# Patient Record
Sex: Female | Born: 1990 | Race: Black or African American | Marital: Single | Smoking: Never smoker
Health system: Southern US, Academic
[De-identification: ages and names within clinical notes are randomized; demographics above are authoritative.]

## PROBLEM LIST (undated history)

## (undated) ENCOUNTER — Inpatient Hospital Stay (HOSPITAL_COMMUNITY): Payer: Self-pay

## (undated) DIAGNOSIS — E119 Type 2 diabetes mellitus without complications: Secondary | ICD-10-CM

## (undated) DIAGNOSIS — N390 Urinary tract infection, site not specified: Secondary | ICD-10-CM

## (undated) DIAGNOSIS — J309 Allergic rhinitis, unspecified: Secondary | ICD-10-CM

## (undated) HISTORY — DX: Urinary tract infection, site not specified: N39.0

## (undated) HISTORY — PX: NO PAST SURGERIES: SHX2092

## (undated) HISTORY — DX: Allergic rhinitis, unspecified: J30.9

## (undated) HISTORY — DX: Type 2 diabetes mellitus without complications: E11.9

## (undated) HISTORY — DX: Type 2 diabetes mellitus without complications (CMS HCC): E11.9

---
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
## 2013-08-11 ENCOUNTER — Other Ambulatory Visit (HOSPITAL_COMMUNITY)
Admission: RE | Admit: 2013-08-11 | Discharge: 2013-08-11 | Disposition: A | Discharge status: Home / Self Care | Payer: Self-pay | Source: Ambulatory Visit | Attending: Family Medicine | Admitting: Family Medicine

## 2013-08-11 ENCOUNTER — Emergency Department (INDEPENDENT_AMBULATORY_CARE_PROVIDER_SITE_OTHER)
Admission: EM | Admit: 2013-08-11 | Discharge: 2013-08-11 | Disposition: A | Discharge status: Home / Self Care | Payer: Self-pay | Source: Home / Self Care

## 2013-08-11 ENCOUNTER — Encounter (HOSPITAL_COMMUNITY): Payer: Self-pay | Admitting: Emergency Medicine

## 2013-08-11 DIAGNOSIS — N72 Inflammatory disease of cervix uteri: Secondary | ICD-10-CM

## 2013-08-11 DIAGNOSIS — K625 Hemorrhage of anus and rectum: Secondary | ICD-10-CM

## 2013-08-11 DIAGNOSIS — M545 Low back pain, unspecified: Secondary | ICD-10-CM

## 2013-08-11 DIAGNOSIS — B3731 Acute candidiasis of vulva and vagina: Secondary | ICD-10-CM

## 2013-08-11 DIAGNOSIS — N898 Other specified noninflammatory disorders of vagina: Secondary | ICD-10-CM

## 2013-08-11 DIAGNOSIS — A499 Bacterial infection, unspecified: Secondary | ICD-10-CM

## 2013-08-11 DIAGNOSIS — N760 Acute vaginitis: Secondary | ICD-10-CM | POA: Insufficient documentation

## 2013-08-11 DIAGNOSIS — B9689 Other specified bacterial agents as the cause of diseases classified elsewhere: Secondary | ICD-10-CM

## 2013-08-11 DIAGNOSIS — B373 Candidiasis of vulva and vagina: Secondary | ICD-10-CM

## 2013-08-11 DIAGNOSIS — Z113 Encounter for screening for infections with a predominantly sexual mode of transmission: Secondary | ICD-10-CM | POA: Insufficient documentation

## 2013-08-11 HISTORY — DX: Type 2 diabetes mellitus without complications: E11.9

## 2013-08-11 LAB — POCT URINALYSIS DIP (DEVICE)
Bilirubin Urine: NEGATIVE
Glucose, UA: 100 mg/dL — AB
Ketones, ur: NEGATIVE mg/dL
NITRITE: NEGATIVE
PH: 6.5 (ref 5.0–8.0)
PROTEIN: NEGATIVE mg/dL
Specific Gravity, Urine: 1.025 (ref 1.005–1.030)
Urobilinogen, UA: 0.2 mg/dL (ref 0.0–1.0)

## 2013-08-11 LAB — POCT PREGNANCY, URINE: Preg Test, Ur: NEGATIVE

## 2013-08-11 MED ORDER — AZITHROMYCIN 250 MG PO TABS: ORAL_TABLET | ORAL | Status: DC

## 2013-08-11 MED ORDER — FLUCONAZOLE 150 MG PO TABS: ORAL_TABLET | ORAL | Status: DC

## 2013-08-11 MED ORDER — METRONIDAZOLE 500 MG PO TABS: 500.0000 mg | ORAL_TABLET | Freq: Two times a day (BID) | ORAL | Status: DC

## 2013-08-11 NOTE — Discharge Instructions (Signed)
Back Pain, Adult Back pain is very common. The pain often gets better over time. The cause of back pain is usually not dangerous. Most people can learn to manage their back pain on their own.  HOME CARE   Stay active. Start with short walks on flat ground if you can. Try to walk farther each day.  Do not sit, drive, or stand in one place for more than 30 minutes. Do not stay in bed.  Do not avoid exercise or work. Activity can help your back heal faster.  Be careful when you bend or lift an object. Bend at your knees, keep the object close to you, and do not twist.  Sleep on a firm mattress. Lie on your side, and bend your knees. If you lie on your back, put a pillow under your knees.  Only take medicines as told by your doctor.  Put ice on the injured area.  Put ice in a plastic bag.  Place a towel between your skin and the bag.  Leave the ice on for 15-20 minutes, 03-04 times a day for the first 2 to 3 days. After that, you can switch between ice and heat packs.  Ask your doctor about back exercises or massage.  Avoid feeling anxious or stressed. Find good ways to deal with stress, such as exercise. GET HELP RIGHT AWAY IF:   Your pain does not go away with rest or medicine.  Your pain does not go away in 1 week.  You have new problems.  You do not feel well.  The pain spreads into your legs.  You cannot control when you poop (bowel movement) or pee (urinate).  Your arms or legs feel weak or lose feeling (numbness).  You feel sick to your stomach (nauseous) or throw up (vomit).  You have belly (abdominal) pain.  You feel like you may pass out (faint). MAKE SURE YOU:   Understand these instructions.  Will watch your condition.  Will get help right away if you are not doing well or get worse. Document Released: 06/25/2007 Document Revised: 03/31/2011 Document Reviewed: 05/10/2013 [facility] Patient Information 2015 [place], [place]. This information is not intended  to replace advice given to you by your health care provider. Make sure you discuss any questions you have with your health care provider.  Bacterial Vaginosis Bacterial vaginosis is a vaginal infection that occurs when the normal balance of bacteria in the vagina is disrupted. It results from an overgrowth of certain bacteria. This is the most common vaginal infection in women of childbearing age. Treatment is important to prevent complications, especially in pregnant women, as it can cause a premature delivery. CAUSES  Bacterial vaginosis is caused by an increase in harmful bacteria that are normally present in smaller amounts in the vagina. Several different kinds of bacteria can cause bacterial vaginosis. However, the reason that the condition develops is not fully understood. RISK FACTORS Certain activities or behaviors can put you at an increased risk of developing bacterial vaginosis, including:  Having a new sex partner or multiple sex partners.  Douching.  Using an intrauterine device (IUD) for contraception. Women do not get bacterial vaginosis from toilet seats, bedding, swimming pools, or contact with objects around them. SIGNS AND SYMPTOMS  Some women with bacterial vaginosis have no signs or symptoms. Common symptoms include:  Grey vaginal discharge.  A fishlike odor with discharge, especially after sexual intercourse.  Itching or burning of the vagina and vulva.  Burning or pain with  urination. DIAGNOSIS  Your health care provider will take a medical history and examine the vagina for signs of bacterial vaginosis. A sample of vaginal fluid may be taken. Your health care provider will look at this sample under a microscope to check for bacteria and abnormal cells. A vaginal pH test may also be done.  TREATMENT  Bacterial vaginosis may be treated with antibiotic medicines. These may be given in the form of a pill or a vaginal cream. A second round of antibiotics may be  prescribed if the condition comes back after treatment.  HOME CARE INSTRUCTIONS   Only take over-the-counter or prescription medicines as directed by your health care provider.  If antibiotic medicine was prescribed, take it as directed. Make sure you finish it even if you start to feel better.  Do not have sex until treatment is completed.  Tell all sexual partners that you have a vaginal infection. They should see their health care provider and be treated if they have problems, such as a mild rash or itching.  Practice safe sex by using condoms and only having one sex partner. SEEK MEDICAL CARE IF:   Your symptoms are not improving after 3 days of treatment.  You have increased discharge or pain.  You have a fever. MAKE SURE YOU:   Understand these instructions.  Will watch your condition.  Will get help right away if you are not doing well or get worse. FOR MORE INFORMATION  Centers for Disease Control and Prevention, Division of STD Prevention: SolutionApps.co.zawww.cdc.gov/std American Sexual Health Association (ASHA): www.ashastd.org  Document Released: 01/06/2005 Document Revised: 10/27/2012 Document Reviewed: 08/18/2012 [facility] Patient Information 2015 [place], [place]. This information is not intended to replace advice given to you by your health care provider. Make sure you discuss any questions you have with your health care provider.  Candidal Vulvovaginitis Candidal vulvovaginitis is an infection of the vagina and vulva. The vulva is the skin around the opening of the vagina. This may cause itching and discomfort in and around the vagina.  HOME CARE  Only take medicine as told by your doctor.  Do not have sex (intercourse) until the infection is healed or as told by your doctor.  Practice safe sex.  Tell your sex partner about your infection.  Do not douche or use tampons.  Wear cotton underwear. Do not wear tight pants or panty hose.  Eat yogurt. This may help treat and  prevent yeast infections. GET HELP RIGHT AWAY IF:   You have a fever.  Your problems get worse during treatment or do not get better in 3 days.  You have discomfort, irritation, or itching in your vagina or vulva area.  You have pain after sex.  You start to get belly (abdominal) pain. MAKE SURE YOU:  Understand these instructions.  Will watch your condition.  Will get help right away if you are not doing well or get worse. Document Released: 04/04/2008 Document Revised: 01/11/2013 Document Reviewed: 04/04/2008 [facility] Patient Information 2015 [place], [place]. This information is not intended to replace advice given to you by your health care provider. Make sure you discuss any questions you have with your health care provider.  Cervicitis Cervicitis is a soreness and swelling (inflammation) of the cervix. Your cervix is located at the bottom of your uterus. It opens up to the vagina. CAUSES   Sexually transmitted infections (STIs).   Allergic reaction.   Medicines or birth control devices that are put in the vagina.   Injury  to the cervix.   Bacterial infections.  RISK FACTORS You are at greater risk if you:  Have unprotected sexual intercourse.  Have sexual intercourse with many partners.  Began sexual intercourse at an early age.  Have a history of STIs. SYMPTOMS  There may be no symptoms. If symptoms occur, they may include:   Gray, white, yellow, or bad-smelling vaginal discharge.   Pain or itching of the area outside the vagina.   Painful sexual intercourse.   Lower abdominal or lower back pain, especially during intercourse.   Frequent urination.   Abnormal vaginal bleeding between periods, after sexual intercourse, or after menopause.   Pressure or a heavy feeling in the pelvis.  DIAGNOSIS  Diagnosis is made after a pelvic exam. Other tests may include:   Examination of any discharge under a microscope (wet prep).   A Pap test.   TREATMENT  Treatment will depend on the cause of cervicitis. If it is caused by an STI, both you and your partner will need to be treated. Antibiotic medicines will be given.  HOME CARE INSTRUCTIONS   Do not have sexual intercourse until your health care provider says it is okay.   Do not have sexual intercourse until your partner has been treated, if your cervicitis is caused by an STI.   Take your antibiotics as directed. Finish them even if you start to feel better.  SEEK MEDICAL CARE IF:  Your symptoms come back.   You have a fever.  MAKE SURE YOU:   Understand these instructions.  Will watch your condition.  Will get help right away if you are not doing well or get worse. Document Released: 01/06/2005 Document Revised: 01/11/2013 Document Reviewed: 06/30/2012 [facility] Patient Information 2015 [place], [place]. This information is not intended to replace advice given to you by your health care provider. Make sure you discuss any questions you have with your health care provider.  Rectal Bleeding  Rectal bleeding is when blood comes out of the opening of the butt (anus). Rectal bleeding may show up as bright red blood or really dark poop (stool). The poop may look dark red, maroon, or black. Rectal bleeding is often a sign that something is wrong. This needs to be checked by a doctor.  HOME CARE  Eat a diet high in fiber. This will help keep your poop soft.  Limit activity.  Drink enough fluids to keep your pee (urine) clear or pale yellow.  Take a warm bath to soothe any pain.  Follow up with your doctor as told. GET HELP RIGHT AWAY IF:  You have more bleeding.  You have black or dark red poop.  You throw up (vomit) blood or it looks like coffee grounds.  You have belly (abdominal) pain or tenderness.  You have a fever.  You feel weak, sick to your stomach (nauseous), or you pass out (faint).  You have pain that is so bad you cannot poop (bowel  movement). MAKE SURE YOU:  Understand these instructions.  Will watch your condition.  Will get help right away if you are not doing well or get worse. Document Released: 09/18/2010 Document Revised: 05/23/2013 Document Reviewed: 09/18/2010 [facility] Patient Information 2015 [place], [place]. This information is not intended to replace advice given to you by your health care provider. Make sure you discuss any questions you have with your health care provider.

## 2013-08-11 NOTE — ED Notes (Signed)
Pt  Has  Symptoms of  Low  abd  Pain  With  Vaginal  Discharge  As  Well  As   Back  Pain  And  Bright  Red  Rectal bleeding      Symptoms     X  6  Days    Getting  Worse     Pt  Ambulates  With a  Steady  Upright fluid  Gait

## 2013-08-11 NOTE — ED Provider Notes (Signed)
Medical screening examination/treatment/procedure(s) were performed by a resident physician or non-physician practitioner and as the supervising physician I was immediately available for consultation/collaboration.  Shelly Flattenavid Merrell, MD Family Medicine   Ozella Rocksavid J Merrell, MD 08/11/13 2138

## 2013-08-11 NOTE — ED Provider Notes (Signed)
CSN: [id number]     Arrival date & time 08/11/13  1402 History   First MD Initiated Contact with Patient 08/11/13 1413     Chief Complaint  Patient presents with  . Vaginal Discharge   (Consider location/radiation/quality/duration/timing/severity/associated sxs/prior Treatment) HPI Comments: 23 y o f with bright red rectal bleeding on 3 occasions on the past week, most recent this AM. No rectal pain. Pelvic pain and vag d/c for 2-3 d.  Sharp pain L low back since last PM Mild Headache.   Past Medical History  Diagnosis Date  . Diabetes mellitus without complication    History reviewed. No pertinent past surgical history. History reviewed. No pertinent family history. History  Substance Use Topics  . Smoking status: Never Smoker   . Smokeless tobacco: Not on file  . Alcohol Use: Yes   OB History   Grav Para Term Preterm Abortions TAB SAB Ect Mult Living                 Review of Systems  Constitutional: Positive for activity change. Negative for fever, chills and fatigue.  Respiratory: Negative for cough and shortness of breath.   Cardiovascular: Negative for chest pain and palpitations.  Gastrointestinal: Negative for nausea, vomiting, abdominal pain, diarrhea and constipation.  Genitourinary: Positive for frequency, vaginal discharge and pelvic pain. Negative for dysuria, hematuria, flank pain, decreased urine volume, vaginal bleeding, difficulty urinating, vaginal pain and menstrual problem.  Musculoskeletal: Negative.   Skin: Negative for rash.  Neurological: Positive for headaches. Negative for dizziness and facial asymmetry.    Allergies  Review of patient's allergies indicates no known allergies.  Home Medications   Prior to Admission medications   Medication Sig Start Date End Date Taking? Authorizing Provider  METFORMIN HCL PO Take by mouth.   Yes Historical Provider, MD  azithromycin (ZITHROMAX) 250 MG tablet Take all 4 tabs po now 08/11/13   Hayden Rasmussenavid Jame Seelig, NP   fluconazole (DIFLUCAN) 150 MG tablet 1 tab po x 1. May repeat in 72 hours if no improvement 08/11/13   Hayden Rasmussenavid Enrica Corliss, NP  metroNIDAZOLE (FLAGYL) 500 MG tablet Take 1 tablet (500 mg total) by mouth 2 (two) times daily. X 7 days 08/11/13   Hayden Rasmussenavid Xitlali Kastens, NP   BP 114/61  Pulse 74  Temp(Src) 97.1 F (36.2 C) (Oral)  Resp 16  SpO2 100%  LMP 07/13/2013 Physical Exam  Nursing note and vitals reviewed. Constitutional: She is oriented to person, place, and time. She appears well-developed and well-nourished. No distress.  HENT:  Head: Normocephalic and atraumatic.  Eyes: Conjunctivae and EOM are normal. Pupils are equal, round, and reactive to light.  Neck: Normal range of motion.  Cardiovascular: Normal rate, regular rhythm and normal heart sounds.   Pulmonary/Chest: Effort normal and breath sounds normal. No respiratory distress.  Abdominal: Soft. Bowel sounds are normal. She exhibits no distension and no mass. There is no tenderness. There is no rebound and no guarding.  Genitourinary: Guaiac negative stool.  NEFG with small deposits of discharge on labia Vagina with clumpy brown discharge adhering to walls Cx L of midline coated with a white to gray thick d/c. Ectocx pink, no lesions + mild CMT and L adnexal discomfort/tenderness. Scant amt of blood in vag vault. Menses due to start tomorrow.   Rectal: no lesions internal or external. No tenderness. No palpated masses/lesions. Guiac negative.  Musculoskeletal: She exhibits no edema and no tenderness.  Neurological: She is alert and oriented to person, place, and time. She exhibits  normal muscle tone.  Skin: Skin is warm and dry. No rash noted.    ED Course  Procedures (including critical care time) Labs Review Labs Reviewed  POCT URINALYSIS DIP (DEVICE) - Abnormal; Notable for the following:    Glucose, UA 100 (*)    Hgb urine dipstick MODERATE (*)    Leukocytes, UA SMALL (*)    All other components within normal limits  URINE CULTURE   POCT PREGNANCY, URINE    Imaging Review No results found. Results for orders placed during the hospital encounter of 08/11/13  POCT URINALYSIS DIP (DEVICE)      Result Value Ref Range   Glucose, UA 100 (*) NEGATIVE mg/dL   Bilirubin Urine NEGATIVE  NEGATIVE   Ketones, ur NEGATIVE  NEGATIVE mg/dL   Specific Gravity, Urine 1.025  1.005 - 1.030   Hgb urine dipstick MODERATE (*) NEGATIVE   pH 6.5  5.0 - 8.0   Protein, ur NEGATIVE  NEGATIVE mg/dL   Urobilinogen, UA 0.2  0.0 - 1.0 mg/dL   Nitrite NEGATIVE  NEGATIVE   Leukocytes, UA SMALL (*) NEGATIVE  POCT PREGNANCY, URINE      Result Value Ref Range   Preg Test, Ur NEGATIVE  NEGATIVE     MDM   1. Bright red rectal bleeding   2. BV (bacterial vaginosis)   3. Vaginal discharge   4. Monilial vaginitis   5. Cervicitis   6. Left-sided low back pain without sciatica    Heat and ibuprofen to left low back. This is musculoskeletal. Rx for azithromycin Flagyl And diflucan Must f/u with PCP for eval of bleeding and diabetes. Info given above. Stable on D/C.     Hayden Rasmussen, NP 08/11/13 1455

## 2013-08-12 LAB — URINE CULTURE: Colony Count: 40000

## 2013-08-13 ENCOUNTER — Telehealth (HOSPITAL_COMMUNITY): Payer: Self-pay

## 2013-08-13 NOTE — ED Notes (Signed)
Patient came in today requesting work note.  Patient has not been back to work since visit on 08/11/2013.  Chart reviewed by Hayden Rasmussenavid Mabe NP.  He stated she could have a work note for the day of the visit

## 2013-08-15 NOTE — ED Notes (Addendum)
GC/Chlamydia neg., Affirm: Candida, Gardnerella and Trich pos., Urine culture: 40,000 colonies multiple bacterial types, none predominant.  I called pt. Pt. verified x 2 and given results.  Pt. told she was adequately treated for the yeast infection with Diflucan and bacterial vaginosis and Trich with Flagyl.  Instructed to finish all of the medication, to notify her partner to be tested for Trich and treated with Flagyl, no sex until she has finished her medication and her partner has been treated and to practice safe sex. [facility]old [facility].  Pt. instructed to get rechecked if any symptoms of UTI.  States she is getting better.  Pt.'s questions answered. Vassie MoselleYork, Makayla Meyer 08/15/2013

## 2013-10-21 ENCOUNTER — Emergency Department (HOSPITAL_COMMUNITY)
Admission: EM | Admit: 2013-10-21 | Discharge: 2013-10-21 | Disposition: A | Discharge status: Home / Self Care | Payer: Self-pay | Source: Home / Self Care | Attending: Family Medicine | Admitting: Family Medicine

## 2013-10-21 ENCOUNTER — Encounter (HOSPITAL_COMMUNITY): Payer: Self-pay | Admitting: Emergency Medicine

## 2013-10-21 DIAGNOSIS — N390 Urinary tract infection, site not specified: Secondary | ICD-10-CM

## 2013-10-21 DIAGNOSIS — E119 Type 2 diabetes mellitus without complications: Secondary | ICD-10-CM

## 2013-10-21 LAB — POCT URINALYSIS DIP (DEVICE)
Bilirubin Urine: NEGATIVE
GLUCOSE, UA: NEGATIVE mg/dL
KETONES UR: NEGATIVE mg/dL
Nitrite: NEGATIVE
Protein, ur: 100 mg/dL — AB
SPECIFIC GRAVITY, URINE: 1.015 (ref 1.005–1.030)
UROBILINOGEN UA: 0.2 mg/dL (ref 0.0–1.0)
pH: 7 (ref 5.0–8.0)

## 2013-10-21 LAB — POCT I-STAT, CHEM 8
BUN: 10 mg/dL (ref 6–23)
CALCIUM ION: 1.2 mmol/L (ref 1.12–1.23)
CHLORIDE: 103 meq/L (ref 96–112)
Creatinine, Ser: 0.6 mg/dL (ref 0.50–1.10)
Glucose, Bld: 163 mg/dL — ABNORMAL HIGH (ref 70–99)
HEMATOCRIT: 44 % (ref 36.0–46.0)
Hemoglobin: 15 g/dL (ref 12.0–15.0)
POTASSIUM: 3.8 meq/L (ref 3.7–5.3)
Sodium: 136 mEq/L — ABNORMAL LOW (ref 137–147)
TCO2: 22 mmol/L (ref 0–100)

## 2013-10-21 LAB — POCT PREGNANCY, URINE: PREG TEST UR: NEGATIVE

## 2013-10-21 MED ORDER — CEPHALEXIN 500 MG PO CAPS: 500.0000 mg | ORAL_CAPSULE | Freq: Two times a day (BID) | ORAL | Status: DC

## 2013-10-21 MED ORDER — METFORMIN HCL 500 MG PO TABS
500.0000 mg | ORAL_TABLET | Freq: Two times a day (BID) | ORAL | Status: DC
Start: 2013-10-21 — End: 2014-09-20

## 2013-10-21 NOTE — ED Notes (Signed)
C/o UTI sx onset 3 days Sx include: dysuria, hematuria, back pain on right side, urinary freq/urgency Denies f/v/n/d, abd pain, gyn sx Alert, no signs of acute distress.

## 2013-10-21 NOTE — ED Provider Notes (Addendum)
Makayla Meyer is a 23 y.o. female who presents to Urgent Care today for UTI symptoms.  Patient has a 3 day history of urinary frequency, urgency, and dysuria. No fevers or chills or vaginal discharge. Patient had mild right-sided low back pain. Symptoms are consistent with prior UTI. No treatment tried yet.  Patient additionally has type 2 diabetes controlled with metformin. She does not have a primary care provider insurance and has run of the medication about 2 weeks ago.   Past Medical History  Diagnosis Date  . Diabetes mellitus without complication    History  Substance Use Topics  . Smoking status: Never Smoker   . Smokeless tobacco: Not on file  . Alcohol Use: Yes   ROS as above Medications: No current facility-administered medications for this encounter.   Current Outpatient Prescriptions  Medication Sig Dispense Refill  . cephALEXin (KEFLEX) 500 MG capsule Take 1 capsule (500 mg total) by mouth 2 (two) times daily.  14 capsule  0  . metFORMIN (GLUCOPHAGE) 500 MG tablet Take 1 tablet (500 mg total) by mouth 2 (two) times daily with a meal.  60 tablet  1    Exam:  BP 107/65  Pulse 85  Temp(Src) 97.1 F (36.2 C) (Oral)  Resp 18  SpO2 96%  LMP 10/10/2013 Gen: Well NAD HEENT: EOMI,  MMM Lungs: Normal work of breathing. CTABL Heart: RRR no MRG Abd: NABS, Soft. Nondistended, Nontender no CV angle tenderness to percussion Exts: Brisk capillary refill, warm and well perfused.   Point-of-care urinalysis positive for moderate blood, 100 protein, moderate leukocyte esterase.  Results for orders placed during the hospital encounter of 10/21/13 (from the past 24 hour(s))  POCT PREGNANCY, URINE     Status: None   Collection Time    10/21/13  8:25 AM      Result Value Ref Range   Preg Test, Ur NEGATIVE  NEGATIVE  POCT I-STAT, CHEM 8     Status: Abnormal   Collection Time    10/21/13  8:37 AM      Result Value Ref Range   Sodium 136 (*) 137 - 147 mEq/L   Potassium 3.8  3.7  - 5.3 mEq/L   Chloride 103  96 - 112 mEq/L   BUN 10  6 - 23 mg/dL   Creatinine, Ser 7.820.60  0.50 - 1.10 mg/dL   Glucose, Bld [id number] (*) 70 - 99 mg/dL   Calcium, Ion 2.131.20  1.12 - 1.23 mmol/L   TCO2 22  0 - 100 mmol/L   Hemoglobin 15.0  12.0 - 15.0 g/dL   HCT 08.644.0  57.836.0 - 46.946.0 %   No results found.  Assessment and Plan: 23 y.o. female with  1) urinary tract infection: Treat with Keflex 2) diabetes: Refill metformin. [facility].  Discussed warning signs or symptoms. Please see discharge instructions. Patient expresses understanding.     Rodolph BongEvan S Luismiguel Lamere, MD 10/21/13 [phone]  Rodolph BongEvan S Curtistine Pettitt, MD 10/21/13 [phone]

## 2013-10-21 NOTE — Discharge Instructions (Signed)
Thank you for coming in today. Please call or see Ms Antionette CharMaggy Mena for assistance with your bill.  You may qualify for reduced or free services.  Her phone number is [phone]. Her email is [email or web address]  [facility] [address] [place], [place] [address] [phone]  Urinary Tract Infection Urinary tract infections (UTIs) can develop anywhere along your urinary tract. Your urinary tract is your body's drainage system for removing wastes and extra water. Your urinary tract includes two kidneys, two ureters, a bladder, and a urethra. Your kidneys are a pair of bean-shaped organs. Each kidney is about the size of your fist. They are located below your ribs, one on each side of your spine. CAUSES Infections are caused by microbes, which are microscopic organisms, including fungi, viruses, and bacteria. These organisms are so small that they can only be seen through a microscope. Bacteria are the microbes that most commonly cause UTIs. SYMPTOMS  Symptoms of UTIs may vary by age and gender of the patient and by the location of the infection. Symptoms in young women typically include a frequent and intense urge to urinate and a painful, burning feeling in the bladder or urethra during urination. Older women and men are more likely to be tired, shaky, and weak and have muscle aches and abdominal pain. A fever may mean the infection is in your kidneys. Other symptoms of a kidney infection include pain in your back or sides below the ribs, nausea, and vomiting. DIAGNOSIS To diagnose a UTI, your caregiver will ask you about your symptoms. Your caregiver also will ask to provide a urine sample. The urine sample will be tested for bacteria and white blood cells. White blood cells are made by your body to help fight infection. TREATMENT  Typically, UTIs can be treated with medication. Because most UTIs are caused by a bacterial infection, they  usually can be treated with the use of antibiotics. The choice of antibiotic and length of treatment depend on your symptoms and the type of bacteria causing your infection. HOME CARE INSTRUCTIONS  If you were prescribed antibiotics, take them exactly as your caregiver instructs you. Finish the medication even if you feel better after you have only taken some of the medication.  Drink enough water and fluids to keep your urine clear or pale yellow.  Avoid caffeine, tea, and carbonated beverages. They tend to irritate your bladder.  Empty your bladder often. Avoid holding urine for long periods of time.  Empty your bladder before and after sexual intercourse.  After a bowel movement, women should cleanse from front to back. Use each tissue only once. SEEK MEDICAL CARE IF:   You have back pain.  You develop a fever.  Your symptoms do not begin to resolve within 3 days. SEEK IMMEDIATE MEDICAL CARE IF:   You have severe back pain or lower abdominal pain.  You develop chills.  You have nausea or vomiting.  You have continued burning or discomfort with urination. MAKE SURE YOU:   Understand these instructions.  Will watch your condition.  Will get help right away if you are not doing well or get worse. Document Released: 10/16/2004 Document Revised: 07/08/2011 Document Reviewed: 02/14/2011 [facility] Patient Information 2015 [place], [place]. This information is not intended to replace advice given to you by your health care provider. Make sure you discuss any questions you have with your health care provider.

## 2014-01-20 NOTE — L&D Delivery Note (Cosign Needed)
Delivery Note At 7:11 PM a viable female was delivered via Vaginal, Spontaneous Delivery (Presentation: Left Occiput Anterior), head slow to deliver, pt placed in McRobert's to open pelvis- called Dr. Despina HiddenEure to be in attendance of delivery d/t anticipated LGA/ClassB DM- he arrived immediately after head delivered and shoulder dystocia noted. He utilized internal maneuvers which finally released anterior arm and infant was delivered 771min45sec after head. At no time was traction applied to head.  Cord was immediately clamped x 2 and cut and infant taken to warmer for awaiting NICU team. APGAR: refer to delivery summary/NICU note; weight: pending at time of note.   Placenta status: Intact, Spontaneous w/ accessory lobe- pt wishes to keep placenta.  Cord: 3 vessels with the following complications: None.  Cord pH: 7.198  Anesthesia: Epidural  Episiotomy: None Lacerations: 4th degree;Perineal repaired by Dr. Despina HiddenEure Suture Repair: 3.0 & 4.0 monocryl Est. Blood Loss (mL): 250  Mom to postpartum.  Baby to NICU. Plans to breastfeed, Paragard for contraception  Makayla DuncansBooker, Makayla Meyer 11/18/2014, 7:51 PM

## 2014-02-12 ENCOUNTER — Encounter (HOSPITAL_COMMUNITY): Payer: Self-pay | Admitting: Emergency Medicine

## 2014-02-12 ENCOUNTER — Emergency Department (HOSPITAL_COMMUNITY)
Admission: EM | Admit: 2014-02-12 | Discharge: 2014-02-12 | Disposition: A | Discharge status: Home / Self Care | Payer: Self-pay | Attending: Emergency Medicine | Admitting: Emergency Medicine

## 2014-02-12 DIAGNOSIS — M542 Cervicalgia: Secondary | ICD-10-CM | POA: Insufficient documentation

## 2014-02-12 DIAGNOSIS — Z79899 Other long term (current) drug therapy: Secondary | ICD-10-CM | POA: Insufficient documentation

## 2014-02-12 DIAGNOSIS — Z792 Long term (current) use of antibiotics: Secondary | ICD-10-CM | POA: Insufficient documentation

## 2014-02-12 DIAGNOSIS — K029 Dental caries, unspecified: Secondary | ICD-10-CM | POA: Insufficient documentation

## 2014-02-12 DIAGNOSIS — R51 Headache: Secondary | ICD-10-CM | POA: Insufficient documentation

## 2014-02-12 DIAGNOSIS — E119 Type 2 diabetes mellitus without complications: Secondary | ICD-10-CM | POA: Insufficient documentation

## 2014-02-12 DIAGNOSIS — K088 Other specified disorders of teeth and supporting structures: Secondary | ICD-10-CM | POA: Insufficient documentation

## 2014-02-12 DIAGNOSIS — Z3202 Encounter for pregnancy test, result negative: Secondary | ICD-10-CM | POA: Insufficient documentation

## 2014-02-12 DIAGNOSIS — K0889 Other specified disorders of teeth and supporting structures: Secondary | ICD-10-CM

## 2014-02-12 LAB — CBG MONITORING, ED: GLUCOSE-CAPILLARY: 205 mg/dL — AB (ref 70–99)

## 2014-02-12 LAB — POC URINE PREG, ED: Preg Test, Ur: NEGATIVE

## 2014-02-12 MED ORDER — CLINDAMYCIN HCL 150 MG PO CAPS: 300.0000 mg | ORAL_CAPSULE | Freq: Three times a day (TID) | ORAL | Status: DC

## 2014-02-12 NOTE — ED Notes (Signed)
Pt c/o dental pain x 2 days.  

## 2014-02-12 NOTE — Discharge Instructions (Signed)
Please call your doctor for a followup appointment within 24-48 hours. When you talk to your doctor please let them know that you were seen in the emergency department and have them acquire all of your records so that they can discuss the findings with you and formulate a treatment plan to fully care for your new and ongoing problems. Please call and set up an appointment with primary care provider, health and wellness Center Please call and set-up an appointment with dentist Please take antibiotics as prescribed on a full stomach Please apply warm compressions and massage Please continue to monitor symptoms closely and if symptoms are to worsen or change (fever greater than 101, chills, sweating, nausea, vomiting, chest pain, shortness of breathe, difficulty breathing, weakness, numbness, tingling, worsening or changes to pain pattern, swelling, numbness, tingling, dizziness, fainting, neck pain, neck stiffness, neck swelling, inability to swallow) please report back to the Emergency Department immediately.     Dental Pain A tooth ache may be caused by cavities (tooth decay). Cavities expose the nerve of the tooth to air and hot or cold temperatures. It may come from an infection or abscess (also called a boil or furuncle) around your tooth. It is also often caused by dental caries (tooth decay). This causes the pain you are having. DIAGNOSIS  Your caregiver can diagnose this problem by exam. TREATMENT   If caused by an infection, it may be treated with medications which kill germs (antibiotics) and pain medications as prescribed by your caregiver. Take medications as directed.  Only take over-the-counter or prescription medicines for pain, discomfort, or fever as directed by your caregiver.  Whether the tooth ache today is caused by infection or dental disease, you should see your dentist as soon as possible for further care. SEEK MEDICAL CARE IF: The exam and treatment you received today has  been provided on an emergency basis only. This is not a substitute for complete medical or dental care. If your problem worsens or new problems (symptoms) appear, and you are unable to meet with your dentist, call or return to this location. SEEK IMMEDIATE MEDICAL CARE IF:   You have a fever.  You develop redness and swelling of your face, jaw, or neck.  You are unable to open your mouth.  You have severe pain uncontrolled by pain medicine. MAKE SURE YOU:   Understand these instructions.  Will watch your condition.  Will get help right away if you are not doing well or get worse. Document Released: 01/06/2005 Document Revised: 03/31/2011 Document Reviewed: 08/25/2007 [facility] Patient Information 2015 [place]. This information is not intended to replace advice given to you by your health care provider. Make sure you discuss any questions you have with your health care provider.  Dental Caries Dental caries is tooth decay. This decay can cause a hole in teeth (cavity) that can get bigger and deeper over time. HOME CARE  Brush and floss your teeth. Do this at least two times a day.  Use a fluoride toothpaste.  Use a mouth rinse if told by your dentist or doctor.  Eat less sugary and starchy foods. Drink less sugary drinks.  Avoid snacking often on sugary and starchy foods. Avoid sipping often on sugary drinks.  Keep regular checkups and cleanings with your dentist.  Use fluoride supplements if told by your dentist or doctor.  Allow fluoride to be applied to teeth if told by your dentist or doctor. Document Released: 10/16/2007 Document Revised: 05/23/2013 Document Reviewed: 01/09/2012 ExitCare  Patient Information 2015 [place], [place]. This information is not intended to replace advice given to you by your health care provider. Make sure you discuss any questions you have with your health care provider.   Emergency Department Resource Guide 1) Find a Doctor and Pay Out  of Pocket Although you won't have to find out who is covered by your insurance plan, it is a good idea to ask around and get recommendations. You will then need to call the office and see if the doctor you have chosen will accept you as a new patient and what types of options they offer for patients who are self-pay. Some doctors offer discounts or will set up payment plans for their patients who do not have insurance, but you will need to ask so you aren't surprised when you get to your appointment.  2) Contact Your Local Health Department Not all health departments have doctors that can see patients for sick visits, but many do, so it is worth a call to see if yours does. If you don't know where your local health department is, you can check in your phone book. The CDC also has a tool to help you locate your state's health department, and many state websites also have listings of all of their local health departments.  3) Find a Walk-in Clinic If your illness is not likely to be very severe or complicated, you may want to try a walk in clinic. These are popping up all over the country in pharmacies, drugstores, and shopping centers. They're usually staffed by nurse practitioners or physician assistants that have been trained to treat common illnesses and complaints. They're usually fairly quick and inexpensive. However, if you have serious medical issues or chronic medical problems, these are probably not your best option.  No Primary Care Doctor: - Call Health Connect at  [phone] - they can help you locate a primary care doctor that  accepts your insurance, provides certain services, etc. - Physician Referral Service- [phone]  Chronic Pain Problems: Organization         Address  Phone   Notes  [facility]  [phone] Patients need to be referred by their primary care doctor.   Medication Assistance: Organization         Address  Phone   Notes  [facility]  [facility]ication [facility] [address] [place], [place] [address] [phone] --Must be a resident of [facility] -- Must have NO insurance coverage whatsoever (no Medicaid/ Medicare, etc.) -- The pt. MUST have a primary care doctor that directs their care regularly and follows them in the community   [facility]ist  [phone]   [employer]  [phone]    Agencies that provide inexpensive medical care: Organization         Address  Phone   Notes  [facility]  [phone]   [facility]    [phone]   [facility] [address] [place], [place] [address] [phone]   [facility] [address] [place] [place] [phone]   Planned Parenthood    [phone]   [facility]    [phone]   [facility]  [address], [place] Phone:  [phone], Fax:  [phone] Hours of Operation:  9 am - 6 pm, M-F.  Also accepts Medicaid/Medicare and self-pay.  [facility]  for Children  [address], [place] Phone: [phone], Fax: [phone]. Hours of Operation:  8:30 am - 5:30 pm, M-F.  Also accepts Medicaid and self-pay.  [facility] [place] [address], [place] Phone: [phone]   [facility] [address] Natasha Bence [place], [place] [phone], Ext. [phone] Mondays & Thursdays: 7-9 AM.  First 15 patients are seen on a first come, first serve basis.    Medicaid-accepting [facility] Providers:  Organization         Address  Phone   Notes  [facility] [address], [place] [phone] Also accepts self-pay patients.  [facility] [address] Laurell Josephs [place], [place]  [phone]   [facility] [address], [place] [phone]   [facility] [address], [place] [phone]   Renaye Rakers [address], [place]   [phone] Only accepts [place] Access [place] patients after they have their name applied to their card.   Self-Pay (no insurance) in [facility]:  Organization         Address  Phone   Notes  Sickle Cell Patients, [facility] [address] [place], [place] [phone]   [facility] [address] [place], [place] [phone]   [facility] [place]  [address], [place] [phone]   [facility]/Dr. Osei-Bonsu  [address], [place] or [address], [place] [phone] Phone number for both [place] and [place] locations is the same.  [facility] [address] [place] [phone]   [facility] [address] [place] or [address] [phone]   [facility] [address], [place] [phone], phone; [phone], fax Sees patients 1st and 3rd Saturday of every month.  Must not qualify for public or private insurance (i.e. Medicaid, Medicare, [place] Health Choice, Veterans' Benefits)  Household income should be no more than 200% of the poverty level The clinic cannot treat you if you are pregnant or think you are pregnant  Sexually transmitted diseases are not treated at the clinic.    Dental Care: Organization         Address  Phone  Notes  [facility] Department of [facility] [address] [place], [place] [phone] Accepts children up to age 53 who are enrolled in [place] or [place] Health Choice; pregnant women with a Medicaid card; and children who have applied for Medicaid or [place] Health Choice, but were declined, whose parents can pay a reduced fee at time of service.  [facility] Department of [facility]  [address], [place]  [phone] Accepts children up to age 72 who are enrolled in [place] or [place] Health Choice; pregnant women with a Medicaid card; and children who have applied for Medicaid or [place] Health Choice, but were declined, whose parents can pay a reduced fee at time of service.  [facility]  [address] [place], [place] [phone] Patients are seen by appointment only. Walk-ins are not accepted. [facility] will see patients 69 years of age and older. Monday - Tuesday (8am-5pm) Most Wednesdays (8:30-5pm) $30 per visit, cash only  [facility]  [address], [facility] [phone] Patients are seen by appointment only. Walk-ins are not accepted. [facility] will see patients 63 years of age and older. One Wednesday Evening (Monthly: Volunteer Based).  $30 per visit, cash only  [employer]  [phone] for adults; Children under age 45, call Graduate Pediatric Dentistry at [phone]. Children aged 86-14, please call [phone] to request a pediatric application.  Dental services are provided in all areas of dental care including fillings, crowns and bridges, complete and partial dentures, implants, gum treatment, root canals, and extractions. Preventive care is also provided. Treatment is provided to both adults and children. Patients are selected via a lottery and there is often a waiting list.   [facility] [address], [place]  [phone] [email or web address]   [facility] [address] [place], [place] [phone], Ext. [phone] Second and Fourth Thursday of each month, opens at 6:30 AM; Clinic ends at 9 AM.  Patients are seen on a first-come first-served basis, and a limited number are seen during each clinic.   [facility]  [address] Ether Griffins [place], [place] [phone]   Eligibility Requirements You must have lived in [place], [place], or [place]  counties for at least the last three months.   You cannot be eligible for state or federal sponsored [place], including [employer], [place], or [employer].   You generally cannot be eligible for healthcare insurance through your employer.    How to apply: Eligibility screenings are held every Tuesday and Wednesday afternoon from 1:00 pm until 4:00 pm. You do not need an appointment for the interview!  [facility] [address] [place], [place] [phone]   [facility] Health Department  [phone]   [facility] Health Department  [phone]   [facility] Health Department  [phone]    Behavioral Health Resources in the Community: Intensive Outpatient Programs Organization         Address  Phone  Notes  [facility] [address], [place], [place] [phone]   [facility] Outpatient [address] [place], [place] [phone]   [facility] [address] [place], [place]  [phone]   [facility] [address],  [place], [place] [phone] or [phone]   Substance Abuse Resources Organization         Address  Phone  Notes  [facility]  [phone]   [facility]  [phone]   [facility] [place]  [phone]   Floydene Flock  [phone]   Residential & Outpatient Substance Abuse Program  [phone]   Psychological Services Organization         Address  Phone  Notes  [facility] Behavioral Health  [phone]   [facility]  [phone]   [facility] [address] [place] [phone] or [phone]    Mobile Crisis Teams Organization         Address  Phone  Notes  [facility], Mobile Crisis Care Unit  [phone]   [facility]  [address] [place], [place] [phone]   Doristine Locks [address] [place]  [place] [phone]    Self-Help/Support Groups Organization         Address  Phone             Notes  [facility] [place] - variety of support groups  336- I7437963 Call for more information  Narcotics Anonymous (NA), [facility] [address], [employer] [place]  2 meetings at this location   Statistician         Address  Phone  Notes  [facility] [address],    [place]  [phone]   [facility]  [address] [place] [address], [place], [place] [phone]   [facility] [address] [place], [place] [phone] Admissions: 8am-3pm M-F  [facility] [address],    [place], [place] [phone]   [facility] [address] [place], [place], [place] [phone]   [facility] [address],  [place], [place] [phone]   [facility] - Intensive Outpatient [address] Laurell Josephs [address], [place], [place] [phone]   [facility] ([facility] [address] [place]  [place], [place] [phone] or [phone]   [facility] ([facility]) [address] [place], [place] [phone] Accepts Medicaid  [facility] [address]  [place] [phone] Substance Abuse/Addiction Treatment   [facility] Organization         Address  Phone  Notes  [facility]  5188825880   Angie Fava, PhD [address] Ervin Knack [place], [place]   [phone] or [phone]   [facility]   [address] [place], [place] [phone]   [facility] [address] [place], [place] [phone] Insurance/Medicaid/sponsorship through [facility] and Families [address]                                    [place], [place] [phone] Therapy/tele-psych/case  [facility] [address], [place] [phone]    Dr. Lolly Mustache  [phone]   [facility]  [employer] [facility] Dept. 1) [address] [place] 2) [address] [place] 3)  [address], [place] [phone]  [phone]   [facility] Child Abuse Hotline 3216414430 or [phone] (After Hours)

## 2014-02-12 NOTE — ED Provider Notes (Signed)
CSN: [id number]     Arrival date & time 02/12/14  1313 History  This chart was scribed for a non-physician practitioner, Raymon MuttonMarissa Ravindra Baranek, PA-C working with Rolan BuccoMelanie Belfi, MD by [place] Peace, ED Scribe. The patient was seen in [facility]. The patient's care was started at 2:38 PM.    Chief Complaint  Patient presents with  . Dental Pain      Patient is a 24 y.o. female presenting with tooth pain. The history is provided by the patient. No language interpreter was used.  Dental Pain Location:  Upper and lower Severity:  Severe Duration:  2 days Timing:  Constant Context: crown fracture and dental caries   Associated symptoms: fever, headaches, neck pain and oral bleeding   Associated symptoms: no congestion   Risk factors: diabetes   Risk factors: no smoking    HPI Comments: Makayla Meyer is a 24 y.o. female with history of DM presents to the Emergency Department complaining of dental pain onset 2 days specifically to right upper and lower aspect of mouth and left lower aspect of mouth. Pt explains she has cracked teeth and dental cavities to affected areas of mouth. She reports episode of fever 2 days ago that have now resolved. Denied complaints of ear pain, eye pain, nausea, or vomiting. She further denies cough, nasal congestion, or visual changes. She notes some blood after brushing. No difficulty swallowing, tongue swelling. Reports last visit with dentist was sometime last year. Pt does not have PCP or dentist that she follows up with regularly.    Past Medical History  Diagnosis Date  . Diabetes mellitus without complication    No past surgical history on file. No family history on file. History  Substance Use Topics  . Smoking status: Never Smoker   . Smokeless tobacco: Not on file  . Alcohol Use: Yes   OB History    No data available     Review of Systems  Constitutional: Positive for fever. Negative for chills.  HENT: Positive for dental problem. Negative for  congestion and ear pain.   Eyes: Negative for pain and visual disturbance.  Respiratory: Negative for cough and shortness of breath.   Cardiovascular: Negative for chest pain.  Gastrointestinal: Negative for nausea and vomiting.  Musculoskeletal: Positive for neck pain. Negative for neck stiffness.  Neurological: Positive for headaches.      Allergies  Review of patient's allergies indicates no known allergies.  Home Medications   Prior to Admission medications   Medication Sig Start Date End Date Taking? Authorizing Provider  acetaminophen (TYLENOL) 500 MG tablet Take 1,000-2,000 mg by mouth every 6 (six) hours as needed for moderate pain.   Yes Historical Provider, MD  metFORMIN (GLUCOPHAGE) 500 MG tablet Take 1 tablet (500 mg total) by mouth 2 (two) times daily with a meal. 10/21/13  Yes Rodolph BongEvan S Corey, MD  cephALEXin (KEFLEX) 500 MG capsule Take 1 capsule (500 mg total) by mouth 2 (two) times daily. Patient not taking: Reported on 02/12/2014 10/21/13   Rodolph BongEvan S Corey, MD  clindamycin (CLEOCIN) 150 MG capsule Take 2 capsules (300 mg total) by mouth 3 (three) times daily. 02/12/14   Kenslee Achorn, PA-C   BP 125/73 mmHg  Pulse 75  Temp(Src) 98.6 F (37 C) (Oral)  Resp 16  SpO2 100% Physical Exam  Constitutional: She is oriented to person, place, and time. She appears well-developed and well-nourished. No distress.  HENT:  Head: Normocephalic and atraumatic.  Mouth/Throat: She does not have dentures. No  trismus in the jaw. Dental caries present. No dental abscesses, uvula swelling or lacerations.    Poor dentition identified with numerous dental caries to maxillary mandibular jawline. Negative abscess noted at this time for drain. Negative uvula swelling. Uvula midline with symmetrical elevation. Negative trismus. Negative subungual lesions identified.  Eyes: Conjunctivae and EOM are normal. Pupils are equal, round, and reactive to light. Right eye exhibits no discharge. Left eye  exhibits no discharge.  Neck: Normal range of motion. Neck supple. No tracheal deviation present.  Negative neck stiffness Negative nuchal rigidity Negative cervical lymphadenopathy Negative meningeal signs  Cardiovascular: Normal rate, regular rhythm and normal heart sounds.   Pulses:      Radial pulses are 2+ on the right side, and 2+ on the left side.  Pulmonary/Chest: Effort normal and breath sounds normal. No respiratory distress. She has no wheezes. She has no rales.  Patient is able to speak in full senses that difficulty Negative use of accessory muscles Negative stridor  Musculoskeletal: Normal range of motion.  Lymphadenopathy:    She has no cervical adenopathy.  Neurological: She is alert and oriented to person, place, and time.  Skin: Skin is warm and dry.  Psychiatric: She has a normal mood and affect. Her behavior is normal.  Nursing note and vitals reviewed.   ED Course  Procedures (including critical care time)  Results for orders placed or performed during the hospital encounter of 02/12/14  POC urine preg, ED (not at [facility])  Result Value Ref Range   Preg Test, Ur NEGATIVE NEGATIVE  CBG monitoring, ED  Result Value Ref Range   Glucose-Capillary 205 (H) 70 - 99 mg/dL   Comment 1 Documented in Chart    Comment 2 Notify RN     Labs Review Labs Reviewed  CBG MONITORING, ED - Abnormal; Notable for the following:    Glucose-Capillary 205 (*)    All other components within normal limits  POC URINE PREG, ED    Imaging Review No results found.   EKG Interpretation None     Medications - No data to display  2:43 PM- Treatment plan was discussed with patient who verbalizes understanding and agrees.    MDM   Final diagnoses:  Pain, dental  Dental caries    Medications - No data to display  Filed Vitals:   02/12/14 1343  BP: 125/73  Pulse: 75  Temp: 98.6 F (37 C)  TempSrc: Oral  Resp: 16  SpO2: 100%   I personally performed the services  described in this documentation, which was scribed in my presence. The recorded information has been reviewed and is accurate.  Urine pregnancy negative. CBG 205. Doubt Ludwig's angina. Doubt peritonsillar abscess. Doubt retropharyngeal abscess. Suspicion to be dental pain secondary to poor dentition with dental caries. Negative drainable abscess noted at this time. Patient stable, afebrile. Patient not septic appearing. Discharged patient. Discharge patient with antibiotics. [facility] and [facility]. Discussed with patient to closely monitor symptoms and if symptoms are to worsen or change to report back to the ED - strict return instructions given.  Patient agreed to plan of care, understood, all questions answered.   Raymon Mutton, PA-C 02/12/14 1528  Rolan Bucco, MD 02/12/14 [phone]

## 2014-05-15 ENCOUNTER — Other Ambulatory Visit: Payer: Self-pay | Admitting: Family Medicine

## 2014-05-15 DIAGNOSIS — Z3682 Encounter for antenatal screening for nuchal translucency: Secondary | ICD-10-CM

## 2014-05-15 LAB — OB RESULTS CONSOLE HEPATITIS B SURFACE ANTIGEN: Hepatitis B Surface Ag: NEGATIVE

## 2014-05-15 LAB — OB RESULTS CONSOLE VARICELLA ZOSTER ANTIBODY, IGG: VARICELLA IGG: IMMUNE

## 2014-05-15 LAB — OB RESULTS CONSOLE HIV ANTIBODY (ROUTINE TESTING): HIV: NONREACTIVE

## 2014-05-15 LAB — OB RESULTS CONSOLE ABO/RH: RH Type: POSITIVE

## 2014-05-15 LAB — OB RESULTS CONSOLE HGB/HCT, BLOOD
HCT: 41 %
HEMOGLOBIN: 13.2 g/dL

## 2014-05-15 LAB — OB RESULTS CONSOLE GC/CHLAMYDIA
CHLAMYDIA, DNA PROBE: NEGATIVE
Gonorrhea: NEGATIVE

## 2014-05-15 LAB — OB RESULTS CONSOLE PLATELET COUNT: Platelets: 304 10*3/uL

## 2014-05-15 LAB — OB RESULTS CONSOLE RPR: RPR: NONREACTIVE

## 2014-05-15 LAB — OB RESULTS CONSOLE RUBELLA ANTIBODY, IGM: Rubella: IMMUNE

## 2014-05-15 LAB — OB RESULTS CONSOLE ANTIBODY SCREEN: Antibody Screen: NEGATIVE

## 2014-05-22 ENCOUNTER — Ambulatory Visit: Payer: Self-pay

## 2014-05-29 ENCOUNTER — Encounter: Payer: Self-pay | Admitting: Family Medicine

## 2014-05-29 ENCOUNTER — Ambulatory Visit (INDEPENDENT_AMBULATORY_CARE_PROVIDER_SITE_OTHER): Payer: Self-pay | Admitting: Family Medicine

## 2014-05-29 ENCOUNTER — Encounter: Payer: Self-pay | Admitting: *Deleted

## 2014-05-29 VITALS — BP 109/64 | HR 89 | Temp 98.5°F | Ht 62.0 in | Wt 119.1 lb

## 2014-05-29 DIAGNOSIS — B373 Candidiasis of vulva and vagina: Secondary | ICD-10-CM

## 2014-05-29 DIAGNOSIS — O98811 Other maternal infectious and parasitic diseases complicating pregnancy, first trimester: Secondary | ICD-10-CM

## 2014-05-29 DIAGNOSIS — B3731 Acute candidiasis of vulva and vagina: Secondary | ICD-10-CM

## 2014-05-29 DIAGNOSIS — O0991 Supervision of high risk pregnancy, unspecified, first trimester: Secondary | ICD-10-CM

## 2014-05-29 DIAGNOSIS — K029 Dental caries, unspecified: Secondary | ICD-10-CM

## 2014-05-29 DIAGNOSIS — O24911 Unspecified diabetes mellitus in pregnancy, first trimester: Secondary | ICD-10-CM

## 2014-05-29 DIAGNOSIS — O24919 Unspecified diabetes mellitus in pregnancy, unspecified trimester: Secondary | ICD-10-CM

## 2014-05-29 DIAGNOSIS — O0990 Supervision of high risk pregnancy, unspecified, unspecified trimester: Secondary | ICD-10-CM | POA: Insufficient documentation

## 2014-05-29 LAB — TSH: TSH: 0.657 u[IU]/mL (ref 0.350–4.500)

## 2014-05-29 LAB — POCT URINALYSIS DIP (DEVICE)
Bilirubin Urine: NEGATIVE
Glucose, UA: 500 mg/dL — AB
Hgb urine dipstick: NEGATIVE
KETONES UR: NEGATIVE mg/dL
NITRITE: NEGATIVE
Protein, ur: NEGATIVE mg/dL
Specific Gravity, Urine: 1.02 (ref 1.005–1.030)
Urobilinogen, UA: 0.2 mg/dL (ref 0.0–1.0)
pH: 7 (ref 5.0–8.0)

## 2014-05-29 LAB — COMPREHENSIVE METABOLIC PANEL
ALK PHOS: 43 U/L (ref 39–117)
ALT: 9 U/L (ref 0–35)
AST: 12 U/L (ref 0–37)
Albumin: 4.1 g/dL (ref 3.5–5.2)
BUN: 7 mg/dL (ref 6–23)
CHLORIDE: 102 meq/L (ref 96–112)
CO2: 23 meq/L (ref 19–32)
Calcium: 9.4 mg/dL (ref 8.4–10.5)
Creat: 0.49 mg/dL — ABNORMAL LOW (ref 0.50–1.10)
GLUCOSE: 117 mg/dL — AB (ref 70–99)
Potassium: 4.1 mEq/L (ref 3.5–5.3)
Sodium: 134 mEq/L — ABNORMAL LOW (ref 135–145)
TOTAL PROTEIN: 7.1 g/dL (ref 6.0–8.3)
Total Bilirubin: 0.4 mg/dL (ref 0.2–1.2)

## 2014-05-29 LAB — HEMOGLOBIN A1C
Hgb A1c MFr Bld: 8.5 % — ABNORMAL HIGH (ref <5.7)
MEAN PLASMA GLUCOSE: 197 mg/dL — AB (ref <117)

## 2014-05-29 MED ORDER — GLUCOSE BLOOD VI STRP: ORAL_STRIP | Status: DC

## 2014-05-29 MED ORDER — TERCONAZOLE 0.4 % VA CREA: 1.0000 | TOPICAL_CREAM | Freq: Every day | VAGINAL | Status: DC

## 2014-05-29 MED ORDER — ASPIRIN 81 MG PO CHEW: 81.0000 mg | CHEWABLE_TABLET | Freq: Every day | ORAL | Status: DC

## 2014-05-29 MED ORDER — ONETOUCH LANCETS MISC: 1.0000 | Freq: Four times a day (QID) | Status: DC

## 2014-05-29 MED ORDER — ACCU-CHEK FASTCLIX LANCETS MISC: 1.0000 [IU] | Freq: Four times a day (QID) | Status: DC

## 2014-05-29 NOTE — Patient Instructions (Addendum)
Following an appropriate diet and keeping your blood sugar under control is the most important thing to do for your health and that of your unborn baby.  Please check your blood sugar 4 times daily.  Please keep accurate BS logs and bring them with you to every visit.  Please bring your meter also.  Goals for Blood sugar should be: 1. Fasting (first thing in the morning before eating) should be less than 90.   2.  2 hours after meals should be less than 120.  Please eat 3 meals and 3 snacks.  Include protein (meat, dairy-cheese, eggs, nuts) with all meals.  Be mindful that carbohydrates increase your blood sugar.  Not just sweet food (cookies, cake, donuts, fruit, juice, soda) but also bread, pasta, rice, and potatoes.  You have to limit how many carbs you are eating.  Adding exercise, as little as 30 minutes a day can decrease your blood sugar.  First Trimester of Pregnancy The first trimester of pregnancy is from week 1 until the end of week 12 (months 1 through 3). A week after a sperm fertilizes an egg, the egg will implant on the wall of the uterus. This embryo will begin to develop into a baby. Genes from you and your partner are forming the baby. The female genes determine whether the baby is a boy or a girl. At 6-8 weeks, the eyes and face are formed, and the heartbeat can be seen on ultrasound. At the end of 12 weeks, all the baby's organs are formed.  Now that you are pregnant, you will want to do everything you can to have a healthy baby. Two of the most important things are to get good prenatal care and to follow your health care provider's instructions. Prenatal care is all the medical care you receive before the baby's birth. This care will help prevent, find, and treat any problems during the pregnancy and childbirth. BODY CHANGES Your body goes through many changes during pregnancy. The changes vary from woman to woman.   You may gain or lose a couple of pounds at first.  You  may feel sick to your stomach (nauseous) and throw up (vomit). If the vomiting is uncontrollable, call your health care provider.  You may tire easily.  You may develop headaches that can be relieved by medicines approved by your health care provider.  You may urinate more often. Painful urination may mean you have a bladder infection.  You may develop heartburn as a result of your pregnancy.  You may develop constipation because certain hormones are causing the muscles that push waste through your intestines to slow down.  You may develop hemorrhoids or swollen, bulging veins (varicose veins).  Your breasts may begin to grow larger and become tender. Your nipples may stick out more, and the tissue that surrounds them (areola) may become darker.  Your gums may bleed and may be sensitive to brushing and flossing.  Dark spots or blotches (chloasma, mask of pregnancy) may develop on your face. This will likely fade after the baby is born.  Your menstrual periods will stop.  You may have a loss of appetite.  You may develop cravings for certain kinds of food.  You may have changes in your emotions from day to day, such as being excited to be pregnant or being concerned that something may go wrong with the pregnancy and baby.  You may have more vivid and strange dreams.  You may have changes in your  hair. These can include thickening of your hair, rapid growth, and changes in texture. Some women also have hair loss during or after pregnancy, or hair that feels dry or thin. Your hair will most likely return to normal after your baby is born. WHAT TO EXPECT AT YOUR PRENATAL VISITS During a routine prenatal visit:  You will be weighed to make sure you and the baby are growing normally.  Your blood pressure will be taken.  Your abdomen will be measured to track your baby's growth.  The fetal heartbeat will be listened to starting around week 10 or 12 of your pregnancy.  Test results  from any previous visits will be discussed. Your health care provider may ask you:  How you are feeling.  If you are feeling the baby move.  If you have had any abnormal symptoms, such as leaking fluid, bleeding, severe headaches, or abdominal cramping.  If you have any questions. Other tests that may be performed during your first trimester include:  Blood tests to find your blood type and to check for the presence of any previous infections. They will also be used to check for low iron levels (anemia) and Rh antibodies. Later in the pregnancy, blood tests for diabetes will be done along with other tests if problems develop.  Urine tests to check for infections, diabetes, or protein in the urine.  An ultrasound to confirm the proper growth and development of the baby.  An amniocentesis to check for possible genetic problems.  Fetal screens for spina bifida and Down syndrome.  You may need other tests to make sure you and the baby are doing well. HOME CARE INSTRUCTIONS  Medicines  Follow your health care provider's instructions regarding medicine use. Specific medicines may be either safe or unsafe to take during pregnancy.  Take your prenatal vitamins as directed.  If you develop constipation, try taking a stool softener if your health care provider approves. Diet  Eat regular, well-balanced meals. Choose a variety of foods, such as meat or vegetable-based protein, fish, milk and low-fat dairy products, vegetables, fruits, and whole grain breads and cereals. Your health care provider will help you determine the amount of weight gain that is right for you.  Avoid raw meat and uncooked cheese. These carry germs that can cause birth defects in the baby.  Eating four or five small meals rather than three large meals a day may help relieve nausea and vomiting. If you start to feel nauseous, eating a few soda crackers can be helpful. Drinking liquids between meals instead of during  meals also seems to help nausea and vomiting.  If you develop constipation, eat more high-fiber foods, such as fresh vegetables or fruit and whole grains. Drink enough fluids to keep your urine clear or pale yellow. Activity and Exercise  Exercise only as directed by your health care provider. Exercising will help you:  Control your weight.  Stay in shape.  Be prepared for labor and delivery.  Experiencing pain or cramping in the lower abdomen or low back is a good sign that you should stop exercising. Check with your health care provider before continuing normal exercises.  Try to avoid standing for long periods of time. Move your legs often if you must stand in one place for a long time.  Avoid heavy lifting.  Wear low-heeled shoes, and practice good posture.  You may continue to have sex unless your health care provider directs you otherwise. Relief of Pain or Discomfort  Wear a good support bra for breast tenderness.   Take warm sitz baths to soothe any pain or discomfort caused by hemorrhoids. Use hemorrhoid cream if your health care provider approves.   Rest with your legs elevated if you have leg cramps or low back pain.  If you develop varicose veins in your legs, wear support hose. Elevate your feet for 15 minutes, 3-4 times a day. Limit salt in your diet. Prenatal Care  Schedule your prenatal visits by the twelfth week of pregnancy. They are usually scheduled monthly at first, then more often in the last 2 months before delivery.  Write down your questions. Take them to your prenatal visits.  Keep all your prenatal visits as directed by your health care provider. Safety  Wear your seat belt at all times when driving.  Make a list of emergency phone numbers, including numbers for family, friends, the hospital, and police and fire departments. General Tips  Ask your health care provider for a referral to a local prenatal education class. Begin classes no later  than at the beginning of month 6 of your pregnancy.  Ask for help if you have counseling or nutritional needs during pregnancy. Your health care provider can offer advice or refer you to specialists for help with various needs.  Do not use hot tubs, steam rooms, or saunas.  Do not douche or use tampons or scented sanitary pads.  Do not cross your legs for long periods of time.  Avoid cat litter boxes and soil used by cats. These carry germs that can cause birth defects in the baby and possibly loss of the fetus by miscarriage or stillbirth.  Avoid all smoking, herbs, alcohol, and medicines not prescribed by your health care provider. Chemicals in these affect the formation and growth of the baby.  Schedule a dentist appointment. At home, brush your teeth with a soft toothbrush and be gentle when you floss. SEEK MEDICAL CARE IF:   You have dizziness.  You have mild pelvic cramps, pelvic pressure, or nagging pain in the abdominal area.  You have persistent nausea, vomiting, or diarrhea.  You have a bad smelling vaginal discharge.  You have pain with urination.  You notice increased swelling in your face, hands, legs, or ankles. SEEK IMMEDIATE MEDICAL CARE IF:   You have a fever.  You are leaking fluid from your vagina.  You have spotting or bleeding from your vagina.  You have severe abdominal cramping or pain.  You have rapid weight gain or loss.  You vomit blood or material that looks like coffee grounds.  You are exposed to [place] and have never had them.  You are exposed to fifth disease or chickenpox.  You develop a severe headache.  You have shortness of breath.  You have any kind of trauma, such as from a fall or a car accident. Document Released: 12/31/2000 Document Revised: 05/23/2013 Document Reviewed: 11/16/2012 [facility] Patient Information 2015 [place], [place]. This information is not intended to replace advice given to you by your health care  provider. Make sure you discuss any questions you have with your health care provider.  Breastfeeding Deciding to breastfeed is one of the best choices you can make for you and your baby. A change in hormones during pregnancy causes your breast tissue to grow and increases the number and size of your milk ducts. These hormones also allow proteins, sugars, and fats from your blood supply to make breast milk in your milk-producing glands. Hormones  prevent breast milk from being released before your baby is born as well as prompt milk flow after birth. Once breastfeeding has begun, thoughts of your baby, as well as his or her sucking or crying, can stimulate the release of milk from your milk-producing glands.  BENEFITS OF BREASTFEEDING For Your Baby  Your first milk (colostrum) helps your baby's digestive system function better.   There are antibodies in your milk that help your baby fight off infections.   Your baby has a lower incidence of asthma, allergies, and sudden infant death syndrome.   The nutrients in breast milk are better for your baby than infant formulas and are designed uniquely for your baby's needs.   Breast milk improves your baby's brain development.   Your baby is less likely to develop other conditions, such as childhood obesity, asthma, or type 2 diabetes mellitus.  For You   Breastfeeding helps to create a very special bond between you and your baby.   Breastfeeding is convenient. Breast milk is always available at the correct temperature and costs nothing.   Breastfeeding helps to burn calories and helps you lose the weight gained during pregnancy.   Breastfeeding makes your uterus contract to its prepregnancy size faster and slows bleeding (lochia) after you give birth.   Breastfeeding helps to lower your risk of developing type 2 diabetes mellitus, osteoporosis, and breast or ovarian cancer later in life. SIGNS THAT YOUR BABY IS HUNGRY Early Signs of  Hunger  Increased alertness or activity.  Stretching.  Movement of the head from side to side.  Movement of the head and opening of the mouth when the corner of the mouth or cheek is stroked (rooting).  Increased sucking sounds, smacking lips, cooing, sighing, or squeaking.  Hand-to-mouth movements.  Increased sucking of fingers or hands. Late Signs of Hunger  Fussing.  Intermittent crying. Extreme Signs of Hunger Signs of extreme hunger will require calming and consoling before your baby will be able to breastfeed successfully. Do not wait for the following signs of extreme hunger to occur before you initiate breastfeeding:   Restlessness.  A loud, strong cry.   Screaming. BREASTFEEDING BASICS Breastfeeding Initiation  Find a comfortable place to sit or lie down, with your neck and back well supported.  Place a pillow or rolled up blanket under your baby to bring him or her to the level of your breast (if you are seated). Nursing pillows are specially designed to help support your arms and your baby while you breastfeed.  Make sure that your baby's abdomen is facing your abdomen.   Gently massage your breast. With your fingertips, massage from your chest wall toward your nipple in a circular motion. This encourages milk flow. You may need to continue this action during the feeding if your milk flows slowly.  Support your breast with 4 fingers underneath and your thumb above your nipple. Make sure your fingers are well away from your nipple and your baby's mouth.   Stroke your baby's lips gently with your finger or nipple.   When your baby's mouth is open wide enough, quickly bring your baby to your breast, placing your entire nipple and as much of the colored area around your nipple (areola) as possible into your baby's mouth.   More areola should be visible above your baby's upper lip than below the lower lip.   Your baby's tongue should be between his or her  lower gum and your breast.   Ensure that your  baby's mouth is correctly positioned around your nipple (latched). Your baby's lips should create a seal on your breast and be turned out (everted).  It is common for your baby to suck about 2-3 minutes in order to start the flow of breast milk. Latching Teaching your baby how to latch on to your breast properly is very important. An improper latch can cause nipple pain and decreased milk supply for you and poor weight gain in your baby. Also, if your baby is not latched onto your nipple properly, he or she may swallow some air during feeding. This can make your baby fussy. Burping your baby when you switch breasts during the feeding can help to get rid of the air. However, teaching your baby to latch on properly is still the best way to prevent fussiness from swallowing air while breastfeeding. Signs that your baby has successfully latched on to your nipple:    Silent tugging or silent sucking, without causing you pain.   Swallowing heard between every 3-4 sucks.    Muscle movement above and in front of his or her ears while sucking.  Signs that your baby has not successfully latched on to nipple:   Sucking sounds or smacking sounds from your baby while breastfeeding.  Nipple pain. If you think your baby has not latched on correctly, slip your finger into the corner of your baby's mouth to break the suction and place it between your baby's gums. Attempt breastfeeding initiation again. Signs of Successful Breastfeeding Signs from your baby:   A gradual decrease in the number of sucks or complete cessation of sucking.   Falling asleep.   Relaxation of his or her body.   Retention of a small amount of milk in his or her mouth.   Letting go of your breast by himself or herself. Signs from you:  Breasts that have increased in firmness, weight, and size 1-3 hours after feeding.   Breasts that are softer immediately after  breastfeeding.  Increased milk volume, as well as a change in milk consistency and color by the fifth day of breastfeeding.   Nipples that are not sore, cracked, or bleeding. Signs That Your Pecola Leisure is Getting Enough Milk  Wetting at least 3 diapers in a 24-hour period. The urine should be clear and pale yellow by age 37 days.  At least 3 stools in a 24-hour period by age 37 days. The stool should be soft and yellow.  At least 3 stools in a 24-hour period by age 72 days. The stool should be seedy and yellow.  No loss of weight greater than 10% of birth weight during the first 17 days of age.  Average weight gain of 4-7 ounces (113-198 g) per week after age 428 days.  Consistent daily weight gain by age 37 days, without weight loss after the age of 2 weeks. After a feeding, your baby may spit up a small amount. This is common. BREASTFEEDING FREQUENCY AND DURATION Frequent feeding will help you make more milk and can prevent sore nipples and breast engorgement. Breastfeed when you feel the need to reduce the fullness of your breasts or when your baby shows signs of hunger. This is called "breastfeeding on demand." Avoid introducing a pacifier to your baby while you are working to establish breastfeeding (the first 4-6 weeks after your baby is born). After this time you may choose to use a pacifier. Research has shown that pacifier use during the first year of a baby's life  decreases the risk of sudden infant death syndrome (SIDS). Allow your baby to feed on each breast as long as he or she wants. Breastfeed until your baby is finished feeding. When your baby unlatches or falls asleep while feeding from the first breast, offer the second breast. Because newborns are often sleepy in the first few weeks of life, you may need to awaken your baby to get him or her to feed. Breastfeeding times will vary from baby to baby. However, the following rules can serve as a guide to help you ensure that your baby is  properly fed:  Newborns (babies 274 weeks of age or younger) may breastfeed every 1-3 hours.  Newborns should not go longer than 3 hours during the day or 5 hours during the night without breastfeeding.  You should breastfeed your baby a minimum of 8 times in a 24-hour period until you begin to introduce solid foods to your baby at around 776 months of age. BREAST MILK PUMPING Pumping and storing breast milk allows you to ensure that your baby is exclusively fed your breast milk, even at times when you are unable to breastfeed. This is especially important if you are going back to work while you are still breastfeeding or when you are not able to be present during feedings. Your lactation consultant can give you guidelines on how long it is safe to store breast milk.  A breast pump is a machine that allows you to pump milk from your breast into a sterile bottle. The pumped breast milk can then be stored in a refrigerator or freezer. Some breast pumps are operated by hand, while others use electricity. Ask your lactation consultant which type will work best for you. Breast pumps can be purchased, but some hospitals and breastfeeding support groups lease breast pumps on a monthly basis. A lactation consultant can teach you how to hand express breast milk, if you prefer not to use a pump.  CARING FOR YOUR BREASTS WHILE YOU BREASTFEED Nipples can become dry, cracked, and sore while breastfeeding. The following recommendations can help keep your breasts moisturized and healthy:  Avoid using soap on your nipples.   Wear a supportive bra. Although not required, special nursing bras and tank tops are designed to allow access to your breasts for breastfeeding without taking off your entire bra or top. Avoid wearing underwire-style bras or extremely tight bras.  Air dry your nipples for 3-204minutes after each feeding.   Use only cotton bra pads to absorb leaked breast milk. Leaking of breast milk between  feedings is normal.   Use lanolin on your nipples after breastfeeding. Lanolin helps to maintain your skin's normal moisture barrier. If you use pure lanolin, you do not need to wash it off before feeding your baby again. Pure lanolin is not toxic to your baby. You may also hand express a few drops of breast milk and gently massage that milk into your nipples and allow the milk to air dry. In the first few weeks after giving birth, some women experience extremely full breasts (engorgement). Engorgement can make your breasts feel heavy, warm, and tender to the touch. Engorgement peaks within 3-5 days after you give birth. The following recommendations can help ease engorgement:  Completely empty your breasts while breastfeeding or pumping. You may want to start by applying warm, moist heat (in the shower or with warm water-soaked hand towels) just before feeding or pumping. This increases circulation and helps the milk flow. If your baby  does not completely empty your breasts while breastfeeding, pump any extra milk after he or she is finished.  Wear a snug bra (nursing or regular) or tank top for 1-2 days to signal your body to slightly decrease milk production.  Apply ice packs to your breasts, unless this is too uncomfortable for you.  Make sure that your baby is latched on and positioned properly while breastfeeding. If engorgement persists after 48 hours of following these recommendations, contact your health care provider or a Advertising copywriter. OVERALL HEALTH CARE RECOMMENDATIONS WHILE BREASTFEEDING  Eat healthy foods. Alternate between meals and snacks, eating 3 of each per day. Because what you eat affects your breast milk, some of the foods may make your baby more irritable than usual. Avoid eating these foods if you are sure that they are negatively affecting your baby.  Drink milk, fruit juice, and water to satisfy your thirst (about 10 glasses a day).   Rest often, relax, and  continue to take your prenatal vitamins to prevent fatigue, stress, and anemia.  Continue breast self-awareness checks.  Avoid chewing and smoking tobacco.  Avoid alcohol and drug use. Some medicines that may be harmful to your baby can pass through breast milk. It is important to ask your health care provider before taking any medicine, including all over-the-counter and prescription medicine as well as vitamin and herbal supplements. It is possible to become pregnant while breastfeeding. If birth control is desired, ask your health care provider about options that will be safe for your baby. SEEK MEDICAL CARE IF:   You feel like you want to stop breastfeeding or have become frustrated with breastfeeding.  You have painful breasts or nipples.  Your nipples are cracked or bleeding.  Your breasts are red, tender, or warm.  You have a swollen area on either breast.  You have a fever or chills.  You have nausea or vomiting.  You have drainage other than breast milk from your nipples.  Your breasts do not become full before feedings by the fifth day after you give birth.  You feel sad and depressed.  Your baby is too sleepy to eat well.  Your baby is having trouble sleeping.   Your baby is wetting less than 3 diapers in a 24-hour period.  Your baby has less than 3 stools in a 24-hour period.  Your baby's skin or the white part of his or her eyes becomes yellow.   Your baby is not gaining weight by 87 days of age. SEEK IMMEDIATE MEDICAL CARE IF:   Your baby is overly tired (lethargic) and does not want to wake up and feed.  Your baby develops an unexplained fever. Document Released: 01/06/2005 Document Revised: 01/11/2013 Document Reviewed: 06/30/2012 [facility] Patient Information 2015 [place], [place]. This information is not intended to replace advice given to you by your health care provider. Make sure you discuss any questions you have with your health care provider.

## 2014-05-29 NOTE — Progress Notes (Signed)
Here for initial visit[facility] C/o occasional cramping- no bleeding. States took all of flagyl pills, but  Still having discharge- yesterday was grayish, thick. C/o perineal  Soreness/ edema. Given prenatal education.

## 2014-05-29 NOTE — Progress Notes (Signed)
Large leuks on UA 

## 2014-05-29 NOTE — Progress Notes (Signed)
[facility]On metformin Baseline labs and Diabetes education today Reports vaginal irritation and discharge.--appears to be yeast.  Will treat topically Begin baby ASA next week. For first screen next week.

## 2014-05-29 NOTE — Progress Notes (Signed)
Nutrition note: 1st visit consult & DM diet review Pt has h/o type 2 DM for ~9935yrs & has been taking metformin. Pt has not been checking her BS lately. Pt has lost 5.9# @ 768w4d. Pt reports she had a lot of N&V in the beginning but this has decreased. Pt reports eating 3 meals & 2 snacks/d. Pt is taking a PNV. Pt reports heartburn occ. NKFA. Pt reports no walking or physical activity. Pt received verbal & written review of DM diet. Encouraged ~30 mins of physical activity/d. Discussed wt gain goals of 25-35# or 1#/wk in 2nd & 3rd trimester. Pt agrees to follow DM diet with 3 meals & 2-3 snacks/d with proper CHO/ protein combination. Pt has WIC & plans to BF. F/u in 4-6 wks Blondell RevealLaura Myrel Rappleye, MS, RD, LDN, [facility]

## 2014-05-30 LAB — PROTEIN / CREATININE RATIO, URINE
Creatinine, Urine: 150.1 mg/dL
PROTEIN CREATININE RATIO: 0.12 (ref <0.15)
Total Protein, Urine: 18 mg/dL (ref 5–24)

## 2014-05-30 LAB — WET PREP, GENITAL: Trich, Wet Prep: NONE SEEN

## 2014-05-31 ENCOUNTER — Telehealth: Payer: Self-pay | Admitting: *Deleted

## 2014-05-31 ENCOUNTER — Other Ambulatory Visit: Payer: Self-pay | Admitting: Obstetrics and Gynecology

## 2014-05-31 MED ORDER — GLUCOSE BLOOD VI STRP
ORAL_STRIP | Status: AC
Start: 2014-05-31 — End: ?

## 2014-05-31 MED ORDER — ACCU-CHEK FASTCLIX LANCETS MISC: 1.0000 [IU] | Freq: Four times a day (QID) | Status: AC

## 2014-05-31 MED ORDER — ACCU-CHEK NANO SMARTVIEW W/DEVICE KIT: 1.0000 | PACK | Freq: Four times a day (QID) | Status: AC

## 2014-05-31 NOTE — Telephone Encounter (Signed)
Pharmacy states patient had been getting one touch supplies but now has Medicaid.  Medicaid will not cover one touch.  Requests we order accu-check nano smart view and supplies.

## 2014-06-05 ENCOUNTER — Encounter (HOSPITAL_COMMUNITY): Payer: Self-pay

## 2014-06-05 ENCOUNTER — Ambulatory Visit (HOSPITAL_COMMUNITY): Admission: RE | Admit: 2014-06-05 | Payer: Self-pay | Source: Ambulatory Visit

## 2014-06-05 ENCOUNTER — Ambulatory Visit (HOSPITAL_COMMUNITY)
Admission: RE | Admit: 2014-06-05 | Discharge: 2014-06-05 | Disposition: A | Discharge status: Home / Self Care | Payer: Self-pay | Source: Ambulatory Visit | Attending: Obstetrics and Gynecology | Admitting: Obstetrics and Gynecology

## 2014-06-05 ENCOUNTER — Ambulatory Visit (HOSPITAL_COMMUNITY)
Admission: RE | Admit: 2014-06-05 | Discharge: 2014-06-05 | Disposition: A | Discharge status: Home / Self Care | Payer: Self-pay | Source: Ambulatory Visit | Attending: Family Medicine | Admitting: Family Medicine

## 2014-06-05 ENCOUNTER — Other Ambulatory Visit (HOSPITAL_COMMUNITY): Payer: Self-pay | Admitting: Maternal and Fetal Medicine

## 2014-06-05 DIAGNOSIS — Z36 Encounter for antenatal screening of mother: Secondary | ICD-10-CM | POA: Insufficient documentation

## 2014-06-05 DIAGNOSIS — O24919 Unspecified diabetes mellitus in pregnancy, unspecified trimester: Secondary | ICD-10-CM

## 2014-06-05 DIAGNOSIS — Z3A12 12 weeks gestation of pregnancy: Secondary | ICD-10-CM | POA: Insufficient documentation

## 2014-06-05 DIAGNOSIS — Z3682 Encounter for antenatal screening for nuchal translucency: Secondary | ICD-10-CM | POA: Insufficient documentation

## 2014-06-07 ENCOUNTER — Other Ambulatory Visit (HOSPITAL_COMMUNITY): Payer: Self-pay | Admitting: Family Medicine

## 2014-06-12 ENCOUNTER — Ambulatory Visit (INDEPENDENT_AMBULATORY_CARE_PROVIDER_SITE_OTHER): Payer: Self-pay | Admitting: Obstetrics and Gynecology

## 2014-06-12 VITALS — BP 107/72 | HR 103 | Temp 97.4°F | Wt 120.8 lb

## 2014-06-12 DIAGNOSIS — O24919 Unspecified diabetes mellitus in pregnancy, unspecified trimester: Secondary | ICD-10-CM

## 2014-06-12 DIAGNOSIS — O24912 Unspecified diabetes mellitus in pregnancy, second trimester: Secondary | ICD-10-CM

## 2014-06-12 DIAGNOSIS — O0992 Supervision of high risk pregnancy, unspecified, second trimester: Secondary | ICD-10-CM

## 2014-06-12 LAB — POCT URINALYSIS DIP (DEVICE)
BILIRUBIN URINE: NEGATIVE
Glucose, UA: >=1000 mg/dL — AB
Ketones, ur: NEGATIVE mg/dL
Leukocytes, UA: NEGATIVE
NITRITE: NEGATIVE
PH: 5.5 (ref 5.0–8.0)
Protein, ur: NEGATIVE mg/dL
Specific Gravity, Urine: 1.01 (ref 1.005–1.030)
Urobilinogen, UA: 0.2 mg/dL (ref 0.0–1.0)

## 2014-06-12 MED ORDER — GLYBURIDE 2.5 MG PO TABS: 2.5000 mg | ORAL_TABLET | Freq: Two times a day (BID) | ORAL | Status: DC

## 2014-06-12 NOTE — Progress Notes (Signed)
24 y.o. G2P0010 at 4171w4d here for routine OB visit today.  1. T2DM, poorly controlled. a1c 8.5% in 1st trimester. Continues to take metformin 500 mg BID. Reviewed blood glucose log today and fasting blood glucose values 90s-110s, 2 hour postprandials 80s-170s. Start glyburide 2.5 mg BID. Continue metformin. Continue checking blood sugars. Will likely need to titrate up further. Anatomy scan scheduled for 18 weeks. Will need fetal echo scheduled for 22 weeks.  2. Routine PNC. VB precautions reviewed.

## 2014-06-12 NOTE — Progress Notes (Signed)
Reports right hip pain; too early for AFP today, order cancelled- will draw at next visit

## 2014-06-26 ENCOUNTER — Encounter: Payer: Self-pay | Admitting: Family Medicine

## 2014-06-26 ENCOUNTER — Ambulatory Visit (INDEPENDENT_AMBULATORY_CARE_PROVIDER_SITE_OTHER): Payer: Medicaid Other | Admitting: Family Medicine

## 2014-06-26 VITALS — BP 105/55 | HR 98 | Temp 98.4°F | Wt 122.5 lb

## 2014-06-26 DIAGNOSIS — O24912 Unspecified diabetes mellitus in pregnancy, second trimester: Secondary | ICD-10-CM | POA: Diagnosis not present

## 2014-06-26 DIAGNOSIS — O0992 Supervision of high risk pregnancy, unspecified, second trimester: Secondary | ICD-10-CM | POA: Diagnosis not present

## 2014-06-26 DIAGNOSIS — O24919 Unspecified diabetes mellitus in pregnancy, unspecified trimester: Secondary | ICD-10-CM

## 2014-06-26 LAB — POCT URINALYSIS DIP (DEVICE)
Bilirubin Urine: NEGATIVE
Ketones, ur: NEGATIVE mg/dL
Nitrite: NEGATIVE
Protein, ur: NEGATIVE mg/dL
Specific Gravity, Urine: 1.015 (ref 1.005–1.030)
Urobilinogen, UA: 0.2 mg/dL (ref 0.0–1.0)
pH: 5.5 (ref 5.0–8.0)

## 2014-06-26 MED ORDER — PRENATAL PLUS 27-1 MG PO TABS: 1.0000 | ORAL_TABLET | Freq: Every day | ORAL | Status: AC

## 2014-06-26 NOTE — Progress Notes (Signed)
Scheduled for fetal echo with Dr Elizebeth Brookingcotton on 08/01/2014 @ 0830 AM [facility] Karleen HampshireSpencer on 08/04/2014 @ 1045 AM (Frankey Poot Biggs, RN)

## 2014-06-26 NOTE — Progress Notes (Signed)
Subjective:   Makayla Meyer is a 24 y.o. G2P0010 at 3880w4d being seen today for her obstetrical visit.  Patient reports no complaints.   Contractions: Contractions: Not present.   Vaginal Bleeding Vag. Bleeding: None.   Fetal Movement: Movement: Present.    Reports good fetal movement.  The following portions of the patient's history were reviewed and updated as appropriate: allergies, current medications, past family history, past medical history, past social history, past surgical history and problem list.   Objective:  BP 105/55 mmHg  Pulse 98  Temp(Src) 98.4 F (36.9 C)  Wt 122 lb 8 oz (55.566 kg)  LMP 03/09/2014 (Exact Date) Fetal Heart Rate: Fetal Heart Rate (bpm): 152  Fetal Movement: Movement: Present   Abdomen: Soft, gravid, appropriate for gestational age.  Pain/Pressure: Pain/Pressure: Present     Vaginal: Vaginal Bleeding Vag. Bleeding: None     Extremities: Edema: Edema: None   Urinalysis:  Results for orders placed or performed in visit on 06/26/14 (from the past 24 hour(s))  POCT urinalysis dip (device)     Status: Abnormal   Collection Time: 06/26/14  8:40 AM  Result Value Ref Range   Glucose, UA >=1000 (A) NEGATIVE mg/dL   Bilirubin Urine NEGATIVE NEGATIVE   Ketones, ur NEGATIVE NEGATIVE mg/dL   Specific Gravity, Urine 1.015 1.005 - 1.030   Hgb urine dipstick TRACE (A) NEGATIVE   pH 5.5 5.0 - 8.0   Protein, ur NEGATIVE NEGATIVE mg/dL   Urobilinogen, UA 0.2 0.0 - 1.0 mg/dL   Nitrite NEGATIVE NEGATIVE   Leukocytes, UA TRACE (A) NEGATIVE   No book today--reports fasting BS are between 80-90's. Postprandial BS are in the 120-130 range  Assessment and Plan:   Pregnancy:  G2P0010 at 4280w4d  1. Diabetes mellitus affecting pregnancy, antepartum  - Ambulatory referral to Pediatric Cardiology for fetal ECHO - Ambulatory referral to Ophthalmology  2. Supervision of high risk pregnancy, antepartum, second trimester  - prenatal vitamin w/FE, FA (PRENATAL 1 + 1)  27-1 MG TABS tablet; Take 1 tablet by mouth daily at 12 noon.  Dispense: 30 each; Refill: 10 - Alpha fetoprotein, maternal - previously normal First Screen  Fetal movement precautions reviewed.  Follow up in 1 week.   Reva Boresanya S Norberto Wishon, MD

## 2014-06-26 NOTE — Patient Instructions (Signed)
Gestational Diabetes Mellitus Gestational diabetes mellitus, often simply referred to as gestational diabetes, is a type of diabetes that some women develop during pregnancy. In gestational diabetes, the pancreas does not make enough insulin (a hormone), the cells are less responsive to the insulin that is made (insulin resistance), or both.Normally, insulin moves sugars from food into the tissue cells. The tissue cells use the sugars for energy. The lack of insulin or the lack of normal response to insulin causes excess sugars to build up in the blood instead of going into the tissue cells. As a result, high blood sugar (hyperglycemia) develops. The effect of high sugar (glucose) levels can cause many problems.  RISK FACTORS You have an increased chance of developing gestational diabetes if you have a family history of diabetes and also have one or more of the following risk factors:  A body mass index over 30 (obesity).  A previous pregnancy with gestational diabetes.  An older age at the time of pregnancy. If blood glucose levels are kept in the normal range during pregnancy, women can have a healthy pregnancy. If your blood glucose levels are not well controlled, there may be risks to you, your unborn baby (fetus), your labor and delivery, or your newborn baby.  SYMPTOMS  If symptoms are experienced, they are much like symptoms you would normally expect during pregnancy. The symptoms of gestational diabetes include:   Increased thirst (polydipsia).  Increased urination (polyuria).  Increased urination during the night (nocturia).  Weight loss. This weight loss may be rapid.  Frequent, recurring infections.  Tiredness (fatigue).  Weakness.  Vision changes, such as blurred vision.  Fruity smell to your breath.  Abdominal pain. DIAGNOSIS Diabetes is diagnosed when blood glucose levels are increased. Your blood glucose level may be checked by one or more of the following blood  tests:  A fasting blood glucose test. You will not be allowed to eat for at least 8 hours before a blood sample is taken.  A random blood glucose test. Your blood glucose is checked at any time of the day regardless of when you ate.  A hemoglobin A1c blood glucose test. A hemoglobin A1c test provides information about blood glucose control over the previous 3 months.  An oral glucose tolerance test (OGTT). Your blood glucose is measured after you have not eaten (fasted) for 1-3 hours and then after you drink a glucose-containing beverage. Since the hormones that cause insulin resistance are highest at about 24-28 weeks of a pregnancy, an OGTT is usually performed during that time. If you have risk factors for gestational diabetes, your health care provider may test you for gestational diabetes earlier than 24 weeks of pregnancy. TREATMENT   You will need to take diabetes medicine or insulin daily to keep blood glucose levels in the desired range.  You will need to match insulin dosing with exercise and healthy food choices. The treatment goal is to maintain the before-meal (preprandial), bedtime, and overnight blood glucose level at 60-99 mg/dL during pregnancy. The treatment goal is to further maintain peak after-meal blood sugar (postprandial glucose) level at 100-140 mg/dL. HOME CARE INSTRUCTIONS   Have your hemoglobin A1c level checked twice a year.  Perform daily blood glucose monitoring as directed by your health care provider. It is common to perform frequent blood glucose monitoring.  Monitor urine ketones when you are ill and as directed by your health care provider.  Take your diabetes medicine and insulin as directed by your health care provider   to maintain your blood glucose level in the desired range.  Never run out of diabetes medicine or insulin. It is needed every day.  Adjust insulin based on your intake of carbohydrates. Carbohydrates can raise blood glucose levels but  need to be included in your diet. Carbohydrates provide vitamins, minerals, and fiber which are an essential part of a healthy diet. Carbohydrates are found in fruits, vegetables, whole grains, dairy products, legumes, and foods containing added sugars.  Eat healthy foods. Alternate 3 meals with 3 snacks.  Maintain a healthy weight gain. The usual total expected weight gain varies according to your prepregnancy body mass index (BMI).  Carry a medical alert card or wear your medical alert jewelry.  Carry a 15-gram carbohydrate snack with you at all times to treat low blood glucose (hypoglycemia). Some examples of 15-gram carbohydrate snacks include:  Glucose tablets, 3 or 4.  Glucose gel, 15-gram tube.  Raisins, 2 tablespoons (24 g).  Jelly beans, 6.  Animal crackers, 8.  Fruit juice, regular soda, or low-fat milk, 4 ounces (120 mL).  Gummy treats, 9.  Recognize hypoglycemia. Hypoglycemia during pregnancy occurs with blood glucose levels of 60 mg/dL and below. The risk for hypoglycemia increases when fasting or skipping meals, during or after intense exercise, and during sleep. Hypoglycemia symptoms can include:  Tremors or shakes.  Decreased ability to concentrate.  Sweating.  Increased heart rate.  Headache.  Dry mouth.  Hunger.  Irritability.  Anxiety.  Restless sleep.  Altered speech or coordination.  Confusion.  Treat hypoglycemia promptly. If you are alert and able to safely swallow, follow the 15:15 rule:  Take 15-20 grams of rapid-acting glucose or carbohydrate. Rapid-acting options include glucose gel, glucose tablets, or 4 ounces (120 mL) of fruit juice, regular soda, or low-fat milk.  Check your blood glucose level 15 minutes after taking the glucose.  Take 15-20 grams more of glucose if the repeat blood glucose level is still 70 mg/dL or below.  Eat a meal or snack within 1 hour once blood glucose levels return to normal.  Be alert to polyuria  (excess urination) and polydipsia (excess thirst) which are early signs of hyperglycemia. An early awareness of hyperglycemia allows for prompt treatment. Treat hyperglycemia as directed by your health care provider.  Engage in at least 30 minutes of physical activity a day or as directed by your health care provider. Ten minutes of physical activity timed 30 minutes after each meal is encouraged to control postprandial blood glucose levels.  Adjust your insulin dosing and food intake as needed if you start a new exercise or sport.  Follow your sick-day plan at any time you are unable to eat or drink as usual.  Avoid tobacco and alcohol use.  Keep all follow-up visits as directed by your health care provider.  Follow the advice of your health care provider regarding your prenatal and post-delivery (postpartum) appointments, meal planning, exercise, medicines, vitamins, blood tests, other medical tests, and physical activities.  Perform daily skin and foot care. Examine your skin and feet daily for cuts, bruises, redness, nail problems, bleeding, blisters, or sores.  Brush your teeth and gums at least twice a day and floss at least once a day. Follow up with your dentist regularly.  Schedule an eye exam during the first trimester of your pregnancy or as directed by your health care provider.  Share your diabetes management plan with your workplace or school.  Stay up-to-date with immunizations.  Learn to manage stress.    Obtain ongoing diabetes education and support as needed.  Learn about and consider breastfeeding your baby.  You should have your blood sugar level checked 6-12 weeks after delivery. This is done with an oral glucose tolerance test (OGTT). SEEK MEDICAL CARE IF:   You are unable to eat food or drink fluids for more than 6 hours.  You have nausea and vomiting for more than 6 hours.  You have a blood glucose level of 200 mg/dL and you have ketones in your  urine.  There is a change in mental status.  You develop vision problems.  You have a persistent headache.  You have upper abdominal pain or discomfort.  You develop an additional serious illness.  You have diarrhea for more than 6 hours.  You have been sick or have had a fever for a couple of days and are not getting better. SEEK IMMEDIATE MEDICAL CARE IF:   You have difficulty breathing.  You no longer feel the baby moving.  You are bleeding or have discharge from your vagina.  You start having premature contractions or labor. MAKE SURE YOU:  Understand these instructions.  Will watch your condition.  Will get help right away if you are not doing well or get worse. Document Released: 04/14/2000 Document Revised: 05/23/2013 Document Reviewed: 08/05/2011 [employer] Patient Information 2015 [employer]. This information is not intended to replace advice given to you by your health care provider. Make sure you discuss any questions you have with your health care provider.  Breastfeeding Deciding to breastfeed is one of the best choices you can make for you and your baby. A change in hormones during pregnancy causes your breast tissue to grow and increases the number and size of your milk ducts. These hormones also allow proteins, sugars, and fats from your blood supply to make breast milk in your milk-producing glands. Hormones prevent breast milk from being released before your baby is born as well as prompt milk flow after birth. Once breastfeeding has begun, thoughts of your baby, as well as his or her sucking or crying, can stimulate the release of milk from your milk-producing glands.  BENEFITS OF BREASTFEEDING For Your Baby  Your first milk (colostrum) helps your baby's digestive system function better.   There are antibodies in your milk that help your baby fight off infections.   Your baby has a lower incidence of asthma, allergies, and sudden infant death  syndrome.   The nutrients in breast milk are better for your baby than infant formulas and are designed uniquely for your baby's needs.   Breast milk improves your baby's brain development.   Your baby is less likely to develop other conditions, such as childhood obesity, asthma, or type 2 diabetes mellitus.  For You   Breastfeeding helps to create a very special bond between you and your baby.   Breastfeeding is convenient. Breast milk is always available at the correct temperature and costs nothing.   Breastfeeding helps to burn calories and helps you lose the weight gained during pregnancy.   Breastfeeding makes your uterus contract to its prepregnancy size faster and slows bleeding (lochia) after you give birth.   Breastfeeding helps to lower your risk of developing type 2 diabetes mellitus, osteoporosis, and breast or ovarian cancer later in life. SIGNS THAT YOUR BABY IS HUNGRY Early Signs of Hunger  Increased alertness or activity.  Stretching.  Movement of the head from side to side.  Movement of the head and opening of the   mouth when the corner of the mouth or cheek is stroked (rooting).  Increased sucking sounds, smacking lips, cooing, sighing, or squeaking.  Hand-to-mouth movements.  Increased sucking of fingers or hands. Late Signs of Hunger  Fussing.  Intermittent crying. Extreme Signs of Hunger Signs of extreme hunger will require calming and consoling before your baby will be able to breastfeed successfully. Do not wait for the following signs of extreme hunger to occur before you initiate breastfeeding:   Restlessness.  A loud, strong cry.   Screaming. BREASTFEEDING BASICS Breastfeeding Initiation  Find a comfortable place to sit or lie down, with your neck and back well supported.  Place a pillow or rolled up blanket under your baby to bring him or her to the level of your breast (if you are seated). Nursing pillows are specially designed  to help support your arms and your baby while you breastfeed.  Make sure that your baby's abdomen is facing your abdomen.   Gently massage your breast. With your fingertips, massage from your chest wall toward your nipple in a circular motion. This encourages milk flow. You may need to continue this action during the feeding if your milk flows slowly.  Support your breast with 4 fingers underneath and your thumb above your nipple. Make sure your fingers are well away from your nipple and your baby's mouth.   Stroke your baby's lips gently with your finger or nipple.   When your baby's mouth is open wide enough, quickly bring your baby to your breast, placing your entire nipple and as much of the colored area around your nipple (areola) as possible into your baby's mouth.   More areola should be visible above your baby's upper lip than below the lower lip.   Your baby's tongue should be between his or her lower gum and your breast.   Ensure that your baby's mouth is correctly positioned around your nipple (latched). Your baby's lips should create a seal on your breast and be turned out (everted).  It is common for your baby to suck about 2-3 minutes in order to start the flow of breast milk. Latching Teaching your baby how to latch on to your breast properly is very important. An improper latch can cause nipple pain and decreased milk supply for you and poor weight gain in your baby. Also, if your baby is not latched onto your nipple properly, he or she may swallow some air during feeding. This can make your baby fussy. Burping your baby when you switch breasts during the feeding can help to get rid of the air. However, teaching your baby to latch on properly is still the best way to prevent fussiness from swallowing air while breastfeeding. Signs that your baby has successfully latched on to your nipple:    Silent tugging or silent sucking, without causing you pain.   Swallowing  heard between every 3-4 sucks.    Muscle movement above and in front of his or her ears while sucking.  Signs that your baby has not successfully latched on to nipple:   Sucking sounds or smacking sounds from your baby while breastfeeding.  Nipple pain. If you think your baby has not latched on correctly, slip your finger into the corner of your baby's mouth to break the suction and place it between your baby's gums. Attempt breastfeeding initiation again. Signs of Successful Breastfeeding Signs from your baby:   A gradual decrease in the number of sucks or complete cessation of sucking.     Falling asleep.   Relaxation of his or her body.   Retention of a small amount of milk in his or her mouth.   Letting go of your breast by himself or herself. Signs from you:  Breasts that have increased in firmness, weight, and size 1-3 hours after feeding.   Breasts that are softer immediately after breastfeeding.  Increased milk volume, as well as a change in milk consistency and color by the fifth day of breastfeeding.   Nipples that are not sore, cracked, or bleeding. Signs That Your Baby is Getting Enough Milk  Wetting at least 3 diapers in a 24-hour period. The urine should be clear and pale yellow by age 5 days.  At least 3 stools in a 24-hour period by age 5 days. The stool should be soft and yellow.  At least 3 stools in a 24-hour period by age 7 days. The stool should be seedy and yellow.  No loss of weight greater than 10% of birth weight during the first 3 days of age.  Average weight gain of 4-7 ounces (113-198 g) per week after age 4 days.  Consistent daily weight gain by age 5 days, without weight loss after the age of 2 weeks. After a feeding, your baby may spit up a small amount. This is common. BREASTFEEDING FREQUENCY AND DURATION Frequent feeding will help you make more milk and can prevent sore nipples and breast engorgement. Breastfeed when you feel the  need to reduce the fullness of your breasts or when your baby shows signs of hunger. This is called "breastfeeding on demand." Avoid introducing a pacifier to your baby while you are working to establish breastfeeding (the first 4-6 weeks after your baby is born). After this time you may choose to use a pacifier. Research has shown that pacifier use during the first year of a baby's life decreases the risk of sudden infant death syndrome (SIDS). Allow your baby to feed on each breast as long as he or she wants. Breastfeed until your baby is finished feeding. When your baby unlatches or falls asleep while feeding from the first breast, offer the second breast. Because newborns are often sleepy in the first few weeks of life, you may need to awaken your baby to get him or her to feed. Breastfeeding times will vary from baby to baby. However, the following rules can serve as a guide to help you ensure that your baby is properly fed:  Newborns (babies 4 weeks of age or younger) may breastfeed every 1-3 hours.  Newborns should not go longer than 3 hours during the day or 5 hours during the night without breastfeeding.  You should breastfeed your baby a minimum of 8 times in a 24-hour period until you begin to introduce solid foods to your baby at around 6 months of age. BREAST MILK PUMPING Pumping and storing breast milk allows you to ensure that your baby is exclusively fed your breast milk, even at times when you are unable to breastfeed. This is especially important if you are going back to work while you are still breastfeeding or when you are not able to be present during feedings. Your lactation consultant can give you guidelines on how long it is safe to store breast milk.  A breast pump is a machine that allows you to pump milk from your breast into a sterile bottle. The pumped breast milk can then be stored in a refrigerator or freezer. Some breast pumps are operated by   hand, while others use  electricity. Ask your lactation consultant which type will work best for you. Breast pumps can be purchased, but some hospitals and breastfeeding support groups lease breast pumps on a monthly basis. A lactation consultant can teach you how to hand express breast milk, if you prefer not to use a pump.  CARING FOR YOUR BREASTS WHILE YOU BREASTFEED Nipples can become dry, cracked, and sore while breastfeeding. The following recommendations can help keep your breasts moisturized and healthy:  Avoid using soap on your nipples.   Wear a supportive bra. Although not required, special nursing bras and tank tops are designed to allow access to your breasts for breastfeeding without taking off your entire bra or top. Avoid wearing underwire-style bras or extremely tight bras.  Air dry your nipples for 3-4minutes after each feeding.   Use only cotton bra pads to absorb leaked breast milk. Leaking of breast milk between feedings is normal.   Use lanolin on your nipples after breastfeeding. Lanolin helps to maintain your skin's normal moisture barrier. If you use pure lanolin, you do not need to wash it off before feeding your baby again. Pure lanolin is not toxic to your baby. You may also hand express a few drops of breast milk and gently massage that milk into your nipples and allow the milk to air dry. In the first few weeks after giving birth, some women experience extremely full breasts (engorgement). Engorgement can make your breasts feel heavy, warm, and tender to the touch. Engorgement peaks within 3-5 days after you give birth. The following recommendations can help ease engorgement:  Completely empty your breasts while breastfeeding or pumping. You may want to start by applying warm, moist heat (in the shower or with warm water-soaked hand towels) just before feeding or pumping. This increases circulation and helps the milk flow. If your baby does not completely empty your breasts while  breastfeeding, pump any extra milk after he or she is finished.  Wear a snug bra (nursing or regular) or tank top for 1-2 days to signal your body to slightly decrease milk production.  Apply ice packs to your breasts, unless this is too uncomfortable for you.  Make sure that your baby is latched on and positioned properly while breastfeeding. If engorgement persists after 48 hours of following these recommendations, contact your health care provider or a lactation consultant. OVERALL HEALTH CARE RECOMMENDATIONS WHILE BREASTFEEDING  Eat healthy foods. Alternate between meals and snacks, eating 3 of each per day. Because what you eat affects your breast milk, some of the foods may make your baby more irritable than usual. Avoid eating these foods if you are sure that they are negatively affecting your baby.  Drink milk, fruit juice, and water to satisfy your thirst (about 10 glasses a day).   Rest often, relax, and continue to take your prenatal vitamins to prevent fatigue, stress, and anemia.  Continue breast self-awareness checks.  Avoid chewing and smoking tobacco.  Avoid alcohol and drug use. Some medicines that may be harmful to your baby can pass through breast milk. It is important to ask your health care provider before taking any medicine, including all over-the-counter and prescription medicine as well as vitamin and herbal supplements. It is possible to become pregnant while breastfeeding. If birth control is desired, ask your health care provider about options that will be safe for your baby. SEEK MEDICAL CARE IF:   You feel like you want to stop breastfeeding or have become   frustrated with breastfeeding.  You have painful breasts or nipples.  Your nipples are cracked or bleeding.  Your breasts are red, tender, or warm.  You have a swollen area on either breast.  You have a fever or chills.  You have nausea or vomiting.  You have drainage other than breast milk from  your nipples.  Your breasts do not become full before feedings by the fifth day after you give birth.  You feel sad and depressed.  Your baby is too sleepy to eat well.  Your baby is having trouble sleeping.   Your baby is wetting less than 3 diapers in a 24-hour period.  Your baby has less than 3 stools in a 24-hour period.  Your baby's skin or the white part of his or her eyes becomes yellow.   Your baby is not gaining weight by 5 days of age. SEEK IMMEDIATE MEDICAL CARE IF:   Your baby is overly tired (lethargic) and does not want to wake up and feed.  Your baby develops an unexplained fever. Document Released: 01/06/2005 Document Revised: 01/11/2013 Document Reviewed: 06/30/2012 [employer] Patient Information 2015 [employer]. This information is not intended to replace advice given to you by your health care provider. Make sure you discuss any questions you have with your health care provider.  

## 2014-06-26 NOTE — Progress Notes (Signed)
Reports on and off sharp pain in left breast.  Requests PNV RX.  Discussed breast feeding tip of the week.

## 2014-06-28 LAB — ALPHA FETOPROTEIN, MATERNAL
AFP: 28.9 ng/mL
Curr Gest Age: 15.4 wks.days
MoM for AFP: 0.72
Open Spina bifida: NEGATIVE
Osb Risk: <1:54600 {titer}

## 2014-07-03 ENCOUNTER — Ambulatory Visit (INDEPENDENT_AMBULATORY_CARE_PROVIDER_SITE_OTHER): Payer: Medicaid Other | Admitting: Obstetrics & Gynecology

## 2014-07-03 ENCOUNTER — Encounter: Payer: Self-pay | Admitting: Obstetrics & Gynecology

## 2014-07-03 VITALS — BP 121/71 | HR 83 | Temp 97.7°F | Wt 122.1 lb

## 2014-07-03 DIAGNOSIS — O0992 Supervision of high risk pregnancy, unspecified, second trimester: Secondary | ICD-10-CM | POA: Diagnosis present

## 2014-07-03 DIAGNOSIS — O24912 Unspecified diabetes mellitus in pregnancy, second trimester: Secondary | ICD-10-CM

## 2014-07-03 DIAGNOSIS — O24919 Unspecified diabetes mellitus in pregnancy, unspecified trimester: Secondary | ICD-10-CM

## 2014-07-03 LAB — POCT URINALYSIS DIP (DEVICE)
Bilirubin Urine: NEGATIVE
Glucose, UA: 500 mg/dL — AB
Ketones, ur: NEGATIVE mg/dL
Nitrite: NEGATIVE
Protein, ur: 30 mg/dL — AB
Specific Gravity, Urine: 1.02 (ref 1.005–1.030)
Urobilinogen, UA: 0.2 mg/dL (ref 0.0–1.0)
pH: 7 (ref 5.0–8.0)

## 2014-07-03 NOTE — Progress Notes (Signed)
Patient has not yet gotten baby aspirin.  Discussed breastfeeding tip of the week.

## 2014-07-03 NOTE — Patient Instructions (Signed)
Return to clinic for any obstetric concerns or go to [facility] for evaluation  

## 2014-07-03 NOTE — Progress Notes (Signed)
Subjective:  Makayla Meyer is a 24 y.o. G2P0010 at [redacted]w[redacted]d being seen today for ongoing prenatal care, has Class B DM.  Patient reports no complaints.  Contractions: Not present.  Vag. Bleeding: None. Movement: Present. Denies leaking of fluid.  Has not started taking ASA, will start today.  The following portions of the patient's history were reviewed and updated as appropriate: allergies, current medications, past family history, past medical history, past social history, past surgical history and problem list.   Objective:   Filed Vitals:   07/03/14 0815  BP: 121/71  Pulse: 83  Temp: 97.7 F (36.5 C)  Weight: 122 lb 1.6 oz (55.384 kg)   Fetal Status: Fetal Heart Rate (bpm): 151   Movement: Present     General:  Alert, oriented and cooperative. Patient is in no acute distress.  Skin: Skin is warm and dry. No rash noted.   Cardiovascular: Normal heart rate noted  Respiratory: Effort and breath sounds normal, no problems with respiration noted  Abdomen: Soft, gravid, appropriate for gestational age. Pain/Pressure: Absent     Vaginal: Vag. Bleeding: None.       Cervix: Not evaluated  Extremities: Normal range of motion.  Edema: None  Mental Status: Normal mood and affect. Normal behavior. Normal judgment and thought content.   Urinalysis: Urine Protein: 1+ Urine Glucose: 3+  Assessment and Plan:  Pregnancy: G2P0010 at [redacted]w[redacted]d  Diabetes mellitus affecting pregnancy, antepartum Blood sugars are within range except for one abnormal fasting of 121.  Continue Glyburide 2.5 mg po bid and Metformin 500mg  po bid; and DM diet. Anatomy scan scheduled on 07/17/14, fetal ECHO 08/01/14, Optho 08/04/14.  Preterm labor symptoms and general obstetric precautions including but not limited to vaginal bleeding, contractions, leaking of fluid and fetal movement were reviewed in detail with the patient.  Please refer to After Visit Summary for other counseling recommendations.   Return in about 2 weeks  (around 07/17/2014) for OB visit.   Tereso Newcomer, MD

## 2014-07-17 ENCOUNTER — Encounter: Payer: Self-pay | Admitting: Obstetrics and Gynecology

## 2014-07-17 ENCOUNTER — Encounter (HOSPITAL_COMMUNITY): Payer: Self-pay

## 2014-07-17 ENCOUNTER — Ambulatory Visit (INDEPENDENT_AMBULATORY_CARE_PROVIDER_SITE_OTHER): Payer: Medicaid Other | Admitting: Obstetrics and Gynecology

## 2014-07-17 ENCOUNTER — Ambulatory Visit (HOSPITAL_COMMUNITY)
Admission: RE | Admit: 2014-07-17 | Discharge: 2014-07-17 | Disposition: A | Discharge status: Home / Self Care | Payer: Medicaid Other | Source: Ambulatory Visit | Attending: Family Medicine | Admitting: Family Medicine

## 2014-07-17 VITALS — BP 118/66 | HR 93 | Temp 97.5°F | Wt 126.0 lb

## 2014-07-17 DIAGNOSIS — O0992 Supervision of high risk pregnancy, unspecified, second trimester: Secondary | ICD-10-CM | POA: Diagnosis not present

## 2014-07-17 DIAGNOSIS — O24312 Unspecified pre-existing diabetes mellitus in pregnancy, second trimester: Secondary | ICD-10-CM | POA: Diagnosis not present

## 2014-07-17 DIAGNOSIS — Z36 Encounter for antenatal screening of mother: Secondary | ICD-10-CM | POA: Diagnosis not present

## 2014-07-17 DIAGNOSIS — Z3A18 18 weeks gestation of pregnancy: Secondary | ICD-10-CM | POA: Insufficient documentation

## 2014-07-17 DIAGNOSIS — O24912 Unspecified diabetes mellitus in pregnancy, second trimester: Secondary | ICD-10-CM

## 2014-07-17 DIAGNOSIS — O24919 Unspecified diabetes mellitus in pregnancy, unspecified trimester: Secondary | ICD-10-CM

## 2014-07-17 DIAGNOSIS — E119 Type 2 diabetes mellitus without complications: Secondary | ICD-10-CM | POA: Insufficient documentation

## 2014-07-17 LAB — POCT URINALYSIS DIP (DEVICE)
BILIRUBIN URINE: NEGATIVE
Glucose, UA: 500 mg/dL — AB
Ketones, ur: 15 mg/dL — AB
NITRITE: NEGATIVE
Protein, ur: NEGATIVE mg/dL
Specific Gravity, Urine: 1.015 (ref 1.005–1.030)
UROBILINOGEN UA: 0.2 mg/dL (ref 0.0–1.0)
pH: 5.5 (ref 5.0–8.0)

## 2014-07-17 NOTE — Progress Notes (Signed)
Subjective:  Makayla Meyer is a 24 y.o. G2P0010 at 8462w4d being seen today for ongoing prenatal care.  Patient reports no complaints.  Contractions: Not present.  Vag. Bleeding: None. Movement: Present. Denies leaking of fluid.   The following portions of the patient's history were reviewed and updated as appropriate: allergies, current medications, past family history, past medical history, past social history, past surgical history and problem list.   Objective:   Filed Vitals:   07/17/14 0922  BP: 118/66  Pulse: 93  Temp: 97.5 F (36.4 C)  Weight: 126 lb (57.153 kg)    Fetal Status: Fetal Heart Rate (bpm): 156   Movement: Present     General:  Alert, oriented and cooperative. Patient is in no acute distress.  Skin: Skin is warm and dry. No rash noted.   Cardiovascular: Normal heart rate noted  Respiratory: Normal respiratory effort, no problems with respiration noted  Abdomen: Soft, gravid, appropriate for gestational age. Pain/Pressure: Absent     Vaginal: Vag. Bleeding: None.       Cervix: Not evaluated        Extremities: Normal range of motion.  Edema: None  Mental Status: Normal mood and affect. Normal behavior. Normal judgment and thought content.   Urinalysis:      Assessment and Plan:  Pregnancy: G2P0010 at 7462w4d  1. Supervision of high risk pregnancy, antepartum, second trimester Doing well without. Reports right hip pain. Patient with history of right hip injury in the past. She feels that it is flaring up. Advised use of a maternity support belt. Tylenol. Follow up with a chiropractor. May need PT referral   2. Diabetes mellitus affecting pregnancy, antepartum Patient did not bring log book but reports highest fasting 94 and highest pp 126 Continue glyburide and metformin   General obstetric precautions including but not limited to vaginal bleeding, contractions, leaking of fluid and fetal movement were reviewed in detail with the patient.  Please refer to After  Visit Summary for other counseling recommendations.   Return in about 3 weeks (around 08/07/2014).   Catalina AntiguaPeggy Sophiagrace Benbrook, MD

## 2014-07-17 NOTE — Progress Notes (Signed)
Has not yet left urine  

## 2014-07-18 ENCOUNTER — Other Ambulatory Visit: Payer: Self-pay | Admitting: Obstetrics and Gynecology

## 2014-07-18 DIAGNOSIS — O24319 Unspecified pre-existing diabetes mellitus in pregnancy, unspecified trimester: Secondary | ICD-10-CM

## 2014-08-03 ENCOUNTER — Encounter: Payer: Self-pay | Admitting: *Deleted

## 2014-08-04 ENCOUNTER — Inpatient Hospital Stay (HOSPITAL_COMMUNITY)
Admission: AD | Admit: 2014-08-04 | Discharge: 2014-08-04 | Disposition: A | Discharge status: Home / Self Care | Payer: Medicaid Other | Source: Ambulatory Visit | Attending: Family Medicine | Admitting: Family Medicine

## 2014-08-04 ENCOUNTER — Encounter (HOSPITAL_COMMUNITY): Payer: Self-pay | Admitting: *Deleted

## 2014-08-04 DIAGNOSIS — Z3A21 21 weeks gestation of pregnancy: Secondary | ICD-10-CM | POA: Diagnosis not present

## 2014-08-04 DIAGNOSIS — O24912 Unspecified diabetes mellitus in pregnancy, second trimester: Secondary | ICD-10-CM

## 2014-08-04 DIAGNOSIS — O219 Vomiting of pregnancy, unspecified: Secondary | ICD-10-CM | POA: Diagnosis not present

## 2014-08-04 DIAGNOSIS — O24919 Unspecified diabetes mellitus in pregnancy, unspecified trimester: Secondary | ICD-10-CM

## 2014-08-04 DIAGNOSIS — O212 Late vomiting of pregnancy: Secondary | ICD-10-CM | POA: Diagnosis not present

## 2014-08-04 LAB — BASIC METABOLIC PANEL
Anion gap: 7 (ref 5–15)
BUN: 8 mg/dL (ref 6–20)
CO2: 20 mmol/L — AB (ref 22–32)
CREATININE: 0.56 mg/dL (ref 0.44–1.00)
Calcium: 8.4 mg/dL — ABNORMAL LOW (ref 8.9–10.3)
Chloride: 105 mmol/L (ref 101–111)
GFR calc Af Amer: >60 mL/min (ref >60)
Glucose, Bld: 154 mg/dL — ABNORMAL HIGH (ref 65–99)
Potassium: 3.6 mmol/L (ref 3.5–5.1)
SODIUM: 132 mmol/L — AB (ref 135–145)

## 2014-08-04 LAB — URINALYSIS, ROUTINE W REFLEX MICROSCOPIC
BILIRUBIN URINE: NEGATIVE
GLUCOSE, UA: 500 mg/dL — AB
Ketones, ur: 40 mg/dL — AB
Leukocytes, UA: NEGATIVE
NITRITE: NEGATIVE
Protein, ur: NEGATIVE mg/dL
SPECIFIC GRAVITY, URINE: 1.02 (ref 1.005–1.030)
UROBILINOGEN UA: 0.2 mg/dL (ref 0.0–1.0)
pH: 7 (ref 5.0–8.0)

## 2014-08-04 LAB — URINE MICROSCOPIC-ADD ON

## 2014-08-04 LAB — GLUCOSE, CAPILLARY: GLUCOSE-CAPILLARY: 140 mg/dL — AB (ref 65–99)

## 2014-08-04 MED ORDER — LACTATED RINGERS IV BOLUS (SEPSIS): 1000.0000 mL | Freq: Once | INTRAVENOUS | Status: DC

## 2014-08-04 MED ORDER — PROMETHAZINE HCL 50 MG PO TABS: 25.0000 mg | ORAL_TABLET | Freq: Four times a day (QID) | ORAL | Status: DC | PRN

## 2014-08-04 MED ORDER — PROMETHAZINE HCL 25 MG PO TABS
12.5000 mg | ORAL_TABLET | ORAL | Status: AC
  Administered 2014-08-04: 12.5 mg via ORAL
  Filled 2014-08-04: qty 1

## 2014-08-04 MED ORDER — ONDANSETRON 8 MG PO TBDP: 8.0000 mg | ORAL_TABLET | Freq: Once | ORAL | Status: DC

## 2014-08-04 NOTE — MAU Provider Note (Signed)
History     CSN: [id number]  Arrival date and time: 08/04/14 1137   First Provider Initiated Contact with Patient 08/04/14 1211      No chief complaint on file.  HPI  Patient is 24 y.o. [id number] 9w1dhere with complaints of N/V/D. Patient ate a hot dog last night and there is some question about the package to the hot dogs having been open for some period of time prior to consumption. Husband also consumed hot dogs and has since had diarrhea. No other sick contacts.   Patient's N/V/D began 7 hours ago. She has had 5 episodes of emesis and 5 episodes of diarrhea. Denies hematemesis and hematochezia. She has not tried any OTC remedies. She has been able to drink some ginger ale and eat one cracker.   +FM, denies LOF, VB, contractions, vaginal discharge.    OB History    Gravida Para Term Preterm AB TAB SAB Ectopic Multiple Living   2    1  1          Past Medical History  Diagnosis Date  . Diabetes mellitus without complication   . UTI (lower urinary tract infection)     Past Surgical History  Procedure Laterality Date  . No past surgeries      Family History  Problem Relation Age of Onset  . Hypertension Mother   . Asthma Mother   . Diabetes Mother   . Depression Mother   . Diabetes Sister     History  Substance Use Topics  . Smoking status: Never Smoker   . Smokeless tobacco: Never Used  . Alcohol Use: Yes     Comment: before pregnancy    Allergies: No Known Allergies  Prescriptions prior to admission  Medication Sig Dispense Refill Last Dose  . aspirin 81 MG chewable tablet Chew 1 tablet (81 mg total) by mouth daily. 90 tablet 3 08/03/2014 at Unknown time  . glyBURIDE (DIABETA) 2.5 MG tablet Take 1 tablet (2.5 mg total) by mouth 2 (two) times daily with a meal. 60 tablet 3 08/03/2014 at Unknown time  . metFORMIN (GLUCOPHAGE) 500 MG tablet Take 1 tablet (500 mg total) by mouth 2 (two) times daily with a meal. 60 tablet 1 08/03/2014 at Unknown time  . prenatal  vitamin w/FE, FA (PRENATAL 1 + 1) 27-1 MG TABS tablet Take 1 tablet by mouth daily at 12 noon. (Patient taking differently: Take 1 tablet by mouth. ) 30 each 10 08/03/2014 at Unknown time  . ACCU-CHEK FASTCLIX LANCETS MISC 1 Units by Does not apply route 4 (four) times daily. 102 each 3 Taking  . acetaminophen (TYLENOL) 500 MG tablet Take 500 mg by mouth every 6 (six) hours as needed for mild pain, moderate pain or headache.    prn  . Blood Glucose Monitoring Suppl (ACCU-CHEK NANO SMARTVIEW) W/DEVICE KIT 1 Device by Does not apply route 4 (four) times daily. 1 kit 0 Taking  . glucose blood (ACCU-CHEK SMARTVIEW) test strip 1 strip four times daily 51 each 12 Taking  . glucose blood test strip One Touch ultra Mini test strips for TA9VBcomplicated by pregnancy O24.119 for testing 4 times daily 100 each 12 Taking  . ONE TOUCH LANCETS MISC 1 each by Does not apply route 4 (four) times daily. Lancets for One Touch Delica for testing 4 times daily DX O24.419 100 each 12 Taking    Review of Systems  Constitutional: Negative for fever, chills and malaise/fatigue.  HENT: Negative for congestion.  Eyes: Negative for blurred vision and double vision.  Respiratory: Negative for cough, hemoptysis and shortness of breath.   Cardiovascular: Negative for chest pain, palpitations, claudication and leg swelling.  Gastrointestinal: Positive for nausea, vomiting, abdominal pain (cramping in lower midline) and diarrhea. Negative for heartburn, constipation and blood in stool.  Genitourinary: Negative for dysuria and hematuria.  Musculoskeletal: Positive for back pain. Negative for myalgias.  Skin: Negative for itching and rash.  Neurological: Negative for dizziness, loss of consciousness and headaches.   Physical Exam   Blood pressure 114/69, pulse 128, temperature 98.2 F (36.8 C), temperature source Oral, resp. rate 18, height 5' 1"  (1.549 m), weight 126 lb 4 oz (57.267 kg), last menstrual period  03/09/2014.  Physical Exam  Constitutional: She is oriented to person, place, and time. She appears well-developed and well-nourished. No distress.  HENT:  Head: Normocephalic and atraumatic.  Mucus membranes mildly dry  Eyes: Conjunctivae and EOM are normal.  Neck: Normal range of motion. No thyromegaly present.  Cardiovascular: Regular rhythm and normal heart sounds.  Exam reveals no gallop and no friction rub.   No murmur heard. tachycardic  Respiratory: Breath sounds normal. No respiratory distress. She has no wheezes. She has no rales.  GI: Soft. Bowel sounds are normal. She exhibits no distension. There is no tenderness.  Genitourinary:  No CVA tenderness  Musculoskeletal: Normal range of motion. She exhibits no edema.  Neurological: She is alert and oriented to person, place, and time.  Skin: Skin is warm and dry. No rash noted. No erythema.  Psychiatric: She has a normal mood and affect. Her behavior is normal.   Fetal Doppler 176 BPM MAU Course  Procedures  MDM UA collected. Phenergan 12.5 mg given. CBG and BMP checked.   Assessment and Plan  Patient is 24 y.o. G2P0010 4w1dreporting N/V/D likely secondary to gastroenteritis 2/2 food poisoning.  -patient feeling better with anti-nausea meds and able to eat some chicken chunks and keep fluids down during her stay here  - fetal kick counts reinforced - preterm labor precautions -plenty of rest and hydration encouraged -->gatorade, water, pedialyte, etc.; increase food intake slowly  -counseled pt not to use OTC anti-diarrhetics  -sent home with Rx for anti-emetics  -counseled to return if symptoms do not resolve in 2-3 days or if feeling extremely weak and having intractable vomiting  -stable for discharge to home    CMelina Schools7/15/2016, 12:23 PM   OB fellow attestation: I have seen and examined this patient; I agree with above documentation in the resident's note.   DMaverick Patmanis a 24y.o. G2P0010  reporting N/V after food consumption +FM, denies LOF, VB, contractions, vaginal discharge.  PE: BP 94/46 mmHg  Pulse 107  Temp(Src) 98.2 F (36.8 C) (Oral)  Resp 16  Ht 5' 1"  (1.549 m)  Wt 126 lb 4 oz (57.267 kg)  BMI 23.87 kg/m2  LMP 03/09/2014 (Exact Date) Gen: calm comfortable, NAD Resp: normal effort, no distress Abd: gravid  ROS, labs, PMH reviewed NST reactive   Plan:  - Nausea/vomitting, suspected food poisoning: no signs of DKA or HHS given DM. PO challenge was unsuccessful and was provided IVF. Reviewed oral hydration at home and reasons to return.  - fetal kick counts reinforced, preterm labor precautions - continue routine follow up in OB clinic  KCaren Macadam MD 6:25 PM

## 2014-08-04 NOTE — Discharge Instructions (Signed)
Food Poisoning °Food poisoning is an illness caused by something you ate or drank. There are over 250 known causes of food poisoning. However, many other causes are unknown. You can be treated even if the exact cause of your food poisoning is not known. In most cases, food poisoning is mild and lasts 1 to 2 days. However, some cases can be serious, especially for people with low immune systems, the elderly, children and infants, and pregnant women. °CAUSES  °Poor personal hygiene, improper cleaning of storage and preparation areas, and unclean utensils can cause infection or tainting (contamination) of foods. The causes of food poisoning are numerous. Infectious agents, such as viruses, bacteria, or parasites, can cause harm by infecting the intestine and disrupting the absorption of nutrients and water. This can cause diarrhea and lead to dehydration. Viruses are responsible for most of the food poisonings in which an agent is found. Parasites are less likely to cause food poisoning. Toxic agents, such as poisonous mushrooms, marine algae, and pesticides can also cause food poisoning. °· Viral causes of food poisoning include: °¨ Norovirus. °¨ Rotavirus. °¨ Hepatitis A. °· Bacterial causes of food poisoning include: °¨ Salmonellae. °¨ Campylobacter. °¨ Bacillus cereus. °¨ Escherichia coli (E. coli). °¨ Shigella. °¨ Listeria monocytogenes. °¨ Clostridium botulinum (botulism). °¨ Vibrio cholerae. °· Parasites that can cause food poisoning include: °¨ Giardia. °¨ Cryptosporidium. °¨ Toxoplasma. °SYMPTOMS °Symptoms may appear several hours or longer after consuming the contaminated food or drink. Symptoms may include: °· Nausea. °· Vomiting. °· Cramping. °· Diarrhea. °· Fever and chills. °· Muscle aches. °DIAGNOSIS °Your health care provider may be able to diagnose food poisoning from a list of what you have recently eaten and results from lab tests. Diagnostic tests may include an exam of the feces. °TREATMENT °In  most cases, treatment focuses on helping to relieve your symptoms and staying well hydrated. Antibiotic medicines are rarely needed. In severe cases, hospitalization may be required. °HOME CARE INSTRUCTIONS  °· Drink enough water and fluids to keep your urine clear or pale yellow. Drink small amounts of fluids frequently and increase as tolerated. °· Ask your health care provider for specific rehydration instructions. °· Avoid: °¨ Foods high in sugar. °¨ Alcohol. °¨ Carbonated drinks. °¨ Tobacco. °¨ Juice. °¨ Caffeine drinks. °¨ Extremely hot or cold fluids. °¨ Fatty, greasy foods. °¨ Too much intake of anything at one time. °¨ Dairy products until 24 to 48 hours after diarrhea stops. °· You may consume probiotics. Probiotics are active cultures of beneficial bacteria. They may lessen the amount and number of diarrheal stools in adults. Probiotics can be found in yogurt with active cultures and in supplements. °· Wash your hands well to avoid spreading the bacteria. °· Take medicines only as directed by your health care provider. Do not give your child aspirin because of the association with Reye's syndrome. °· Ask your health care provider if you should continue to take your regular prescribed and over-the-counter medicines. °PREVENTION  °· Wash your hands, food preparation surfaces, and utensils thoroughly before and after handling raw foods. °· Keep refrigerated foods below 40°F (5°C). °· Serve hot foods immediately or keep them heated above 140°F (60°C). °· Divide large volumes of food into small portions for rapid cooling in the refrigerator. Hot, bulky foods in the refrigerator can raise the temperature of other foods that have already cooled. °· Follow approved canning procedures. °· Heat canned foods thoroughly before tasting. °· When in doubt, throw it out. °· Infants, the elderly, women   who are pregnant, and people with compromised immune systems are especially susceptible to food poisoning. These people  should never consume unpasteurized cheese, unpasteurized cider, raw fish, raw seafood, or raw meat-type products. °SEEK IMMEDIATE MEDICAL CARE IF:  °· You have difficulty breathing, swallowing, talking, or moving. °· You develop blurred vision. °· You are unable to keep fluids down. °· You faint or nearly faint. °· Your eyes turn yellow. °· Vomiting or diarrhea develops or becomes persistent. °· Abdominal pain develops, increases, or localizes in one small area. °· You have a fever. °· The diarrhea becomes excessive or contains blood or mucus. °· You develop excessive weakness, dizziness, or extreme thirst. °· You have no urine for 8 hours. °MAKE SURE YOU:  °· Understand these instructions. °· Will watch your condition. °· Will get help right away if you are not doing well or get worse. °Document Released: 10/05/2003 Document Revised: 05/23/2013 Document Reviewed: 05/23/2010 °[employer]® Patient Information ©2015 [employer]. This information is not intended to replace advice given to you by your health care provider. Make sure you discuss any questions you have with your health care provider. ° °

## 2014-08-04 NOTE — MAU Note (Signed)
Patient presents at [redacted] weeks gestation with c/o nausea, vomiting and diarrhea since 0500 this morning. Fetus active. Denies bleeding or discharge.

## 2014-08-07 ENCOUNTER — Encounter: Payer: Self-pay | Admitting: Obstetrics and Gynecology

## 2014-08-07 ENCOUNTER — Ambulatory Visit (INDEPENDENT_AMBULATORY_CARE_PROVIDER_SITE_OTHER): Payer: Self-pay | Admitting: Obstetrics and Gynecology

## 2014-08-07 VITALS — BP 102/71 | HR 97 | Wt 127.1 lb

## 2014-08-07 DIAGNOSIS — O0992 Supervision of high risk pregnancy, unspecified, second trimester: Secondary | ICD-10-CM

## 2014-08-07 DIAGNOSIS — O24919 Unspecified diabetes mellitus in pregnancy, unspecified trimester: Secondary | ICD-10-CM

## 2014-08-07 LAB — POCT URINALYSIS DIP (DEVICE)
Bilirubin Urine: NEGATIVE
Glucose, UA: 500 mg/dL — AB
KETONES UR: NEGATIVE mg/dL
Nitrite: NEGATIVE
PH: 6.5 (ref 5.0–8.0)
Protein, ur: NEGATIVE mg/dL
Specific Gravity, Urine: 1.02 (ref 1.005–1.030)
UROBILINOGEN UA: 1 mg/dL (ref 0.0–1.0)

## 2014-08-07 NOTE — Progress Notes (Signed)
Reviewed breast feeding tip of the week with patient.  

## 2014-08-07 NOTE — Progress Notes (Signed)
  Subjective:  Makayla SavoyDeja Meyer is a 24 y.o. G2P0010 at [redacted]w[redacted]d being seen today for ongoing prenatal care.  Patient reports no complaints.  Contractions: Not present.  Vag. Bleeding: None. Movement: Present. Denies leaking of fluid.   The following portions of the patient's history were reviewed and updated as appropriate: allergies, current medications, past family history, past medical history, past social history, past surgical history and problem list.   Objective:   Filed Vitals:   08/07/14 0821  BP: 102/71  Pulse: 97  Weight: 127 lb 1.6 oz (57.652 kg)    Fetal Status: Fetal Heart Rate (bpm): 166   Movement: Present     General:  Alert, oriented and cooperative. Patient is in no acute distress.  Skin: Skin is warm and dry. No rash noted.   Cardiovascular: Normal heart rate noted  Respiratory: Normal respiratory effort, no problems with respiration noted  Abdomen: Soft, gravid, appropriate for gestational age. Pain/Pressure: Absent     Vaginal: Vag. Bleeding: None.       Cervix: Not evaluated        Extremities: Normal range of motion.  Edema: None  Mental Status: Normal mood and affect. Normal behavior. Normal judgment and thought content.   Urinalysis:      Assessment and Plan:  Pregnancy: G2P0010 at [redacted]w[redacted]d  1. Supervision of high risk pregnancy, antepartum, second trimester Patient is doing well without  complaints  2. Diabetes mellitus affecting pregnancy, antepartum Fetal echo results reviewed with the patient- normal Patient did not bring log book but reports fasting 85-90 (one abnormal 102) 2 hr pp 100's normally but admits over eating at a birthday party this weekend. Continue current regimen of glyburide and metformin  General obstetric precautions including but not limited to vaginal bleeding, contractions, leaking of fluid and fetal movement were reviewed in detail with the patient. Please refer to After Visit Summary for other counseling recommendations.  Return in  about 3 weeks (around 08/28/2014).   Catalina AntiguaPeggy Reanne Nellums, MD

## 2014-08-21 ENCOUNTER — Ambulatory Visit (HOSPITAL_COMMUNITY)
Admission: RE | Admit: 2014-08-21 | Discharge: 2014-08-21 | Disposition: A | Discharge status: Home / Self Care | Payer: Medicaid Other | Source: Ambulatory Visit | Attending: Obstetrics and Gynecology | Admitting: Obstetrics and Gynecology

## 2014-08-21 ENCOUNTER — Encounter (HOSPITAL_COMMUNITY): Payer: Self-pay

## 2014-08-21 ENCOUNTER — Other Ambulatory Visit: Payer: Self-pay | Admitting: Obstetrics and Gynecology

## 2014-08-21 ENCOUNTER — Other Ambulatory Visit (HOSPITAL_COMMUNITY): Payer: Self-pay | Admitting: Maternal and Fetal Medicine

## 2014-08-21 VITALS — BP 106/70 | HR 87 | Wt 128.4 lb

## 2014-08-21 DIAGNOSIS — E119 Type 2 diabetes mellitus without complications: Secondary | ICD-10-CM

## 2014-08-21 DIAGNOSIS — O352XX0 Maternal care for (suspected) hereditary disease in fetus, not applicable or unspecified: Secondary | ICD-10-CM

## 2014-08-21 DIAGNOSIS — O24319 Unspecified pre-existing diabetes mellitus in pregnancy, unspecified trimester: Secondary | ICD-10-CM | POA: Diagnosis present

## 2014-08-21 DIAGNOSIS — O26872 Cervical shortening, second trimester: Secondary | ICD-10-CM

## 2014-08-21 DIAGNOSIS — O24919 Unspecified diabetes mellitus in pregnancy, unspecified trimester: Principal | ICD-10-CM

## 2014-08-21 DIAGNOSIS — Z3A23 23 weeks gestation of pregnancy: Secondary | ICD-10-CM | POA: Insufficient documentation

## 2014-08-21 DIAGNOSIS — O26879 Cervical shortening, unspecified trimester: Secondary | ICD-10-CM | POA: Insufficient documentation

## 2014-08-21 NOTE — ED Notes (Signed)
Prescription called to [facility] on [address] for progesterone  tablets, vaginally each night #30, refill x 2, per Dr. Claudean Severance.

## 2014-08-28 ENCOUNTER — Encounter: Payer: Self-pay | Admitting: Obstetrics and Gynecology

## 2014-08-28 ENCOUNTER — Ambulatory Visit (INDEPENDENT_AMBULATORY_CARE_PROVIDER_SITE_OTHER): Payer: Self-pay | Admitting: Obstetrics & Gynecology

## 2014-08-28 VITALS — BP 105/61 | HR 93 | Temp 97.5°F | Wt 127.6 lb

## 2014-08-28 DIAGNOSIS — O0992 Supervision of high risk pregnancy, unspecified, second trimester: Secondary | ICD-10-CM

## 2014-08-28 DIAGNOSIS — O24319 Unspecified pre-existing diabetes mellitus in pregnancy, unspecified trimester: Secondary | ICD-10-CM

## 2014-08-28 DIAGNOSIS — O24312 Unspecified pre-existing diabetes mellitus in pregnancy, second trimester: Secondary | ICD-10-CM

## 2014-08-28 DIAGNOSIS — E119 Type 2 diabetes mellitus without complications: Secondary | ICD-10-CM

## 2014-08-28 LAB — POCT URINALYSIS DIP (DEVICE)
BILIRUBIN URINE: NEGATIVE
GLUCOSE, UA: 500 mg/dL — AB
Hgb urine dipstick: NEGATIVE
Ketones, ur: 40 mg/dL — AB
Leukocytes, UA: NEGATIVE
NITRITE: NEGATIVE
PH: 6 (ref 5.0–8.0)
Protein, ur: NEGATIVE mg/dL
SPECIFIC GRAVITY, URINE: 1.025 (ref 1.005–1.030)
Urobilinogen, UA: 0.2 mg/dL (ref 0.0–1.0)

## 2014-08-28 NOTE — Progress Notes (Signed)
   Subjective:wakes with cramps in low abdomen for 30 min  Makayla Meyer is a 24 y.o. G2P0010 at [redacted]w[redacted]d being seen today for ongoing prenatal care.  Patient reports no complaints and cramps.  Contractions: Not present.  Vag. Bleeding: None. Movement: Present. Denies leaking of fluid.   The following portions of the patient's history were reviewed and updated as appropriate: allergies, current medications, past family history, past medical history, past social history, past surgical history and problem list.   Objective:   Filed Vitals:   08/28/14 0831  BP: 105/61  Pulse: 93  Temp: 97.5 F (36.4 C)  Weight: 127 lb 9.6 oz (57.879 kg)    Fetal Status: Fetal Heart Rate (bpm): 161   Movement: Present     General:  Alert, oriented and cooperative. Patient is in no acute distress.  Skin: Skin is warm and dry. No rash noted.   Cardiovascular: Normal heart rate noted  Respiratory: Normal respiratory effort, no problems with respiration noted  Abdomen: Soft, gravid, appropriate for gestational age. Pain/Pressure: Present        Extremities: Normal range of motion.  Edema: Trace  Mental Status: Normal mood and affect. Normal behavior. Normal judgment and thought content.   BGFBS<91, PP<97  Assessment and Plan:  Pregnancy: G2P0010 at [redacted]w[redacted]d  1. Supervision of high risk pregnancy, antepartum, second trimester [redacted]w[redacted]d On prometrium for short cervix [place] on 8/17 2. Maternal pregestational diabetes classes B through R, antepartum Nl range  Preterm labor symptoms and general obstetric precautions including but not limited to vaginal bleeding, contractions, leaking of fluid and fetal movement were reviewed in detail with the patient. Please refer to After Visit Summary for other counseling recommendations.  Return in about 2 weeks (around 09/11/2014).   Adam Phenix, MD

## 2014-08-28 NOTE — Progress Notes (Signed)
Ketones: 40 Breastfeeding tip of the week reviewed

## 2014-08-28 NOTE — Patient Instructions (Signed)
Second Trimester of Pregnancy The second trimester is from week 13 through week 28, months 4 through 6. The second trimester is often a time when you feel your best. Your body has also adjusted to being pregnant, and you begin to feel better physically. Usually, morning sickness has lessened or quit completely, you may have more energy, and you may have an increase in appetite. The second trimester is also a time when the fetus is growing rapidly. At the end of the sixth month, the fetus is about 9 inches long and weighs about 1 pounds. You will likely begin to feel the baby move (quickening) between 18 and 20 weeks of the pregnancy. BODY CHANGES Your body goes through many changes during pregnancy. The changes vary from woman to woman.   Your weight will continue to increase. You will notice your lower abdomen bulging out.  You may begin to get stretch marks on your hips, abdomen, and breasts.  You may develop headaches that can be relieved by medicines approved by your health care provider.  You may urinate more often because the fetus is pressing on your bladder.  You may develop or continue to have heartburn as a result of your pregnancy.  You may develop constipation because certain hormones are causing the muscles that push waste through your intestines to slow down.  You may develop hemorrhoids or swollen, bulging veins (varicose veins).  You may have back pain because of the weight gain and pregnancy hormones relaxing your joints between the bones in your pelvis and as a result of a shift in weight and the muscles that support your balance.  Your breasts will continue to grow and be tender.  Your gums may bleed and may be sensitive to brushing and flossing.  Dark spots or blotches (chloasma, mask of pregnancy) may develop on your face. This will likely fade after the baby is born.  A dark line from your belly button to the pubic area (linea nigra) may appear. This will likely fade  after the baby is born.  You may have changes in your hair. These can include thickening of your hair, rapid growth, and changes in texture. Some women also have hair loss during or after pregnancy, or hair that feels dry or thin. Your hair will most likely return to normal after your baby is born. WHAT TO EXPECT AT YOUR PRENATAL VISITS During a routine prenatal visit:  You will be weighed to make sure you and the fetus are growing normally.  Your blood pressure will be taken.  Your abdomen will be measured to track your baby's growth.  The fetal heartbeat will be listened to.  Any test results from the previous visit will be discussed. Your health care provider may ask you:  How you are feeling.  If you are feeling the baby move.  If you have had any abnormal symptoms, such as leaking fluid, bleeding, severe headaches, or abdominal cramping.  If you have any questions. Other tests that may be performed during your second trimester include:  Blood tests that check for:  Low iron levels (anemia).  Gestational diabetes (between 24 and 28 weeks).  Rh antibodies.  Urine tests to check for infections, diabetes, or protein in the urine.  An ultrasound to confirm the proper growth and development of the baby.  An amniocentesis to check for possible genetic problems.  Fetal screens for spina bifida and Down syndrome. HOME CARE INSTRUCTIONS   Avoid all smoking, herbs, alcohol, and unprescribed   drugs. These chemicals affect the formation and growth of the baby.  Follow your health care provider's instructions regarding medicine use. There are medicines that are either safe or unsafe to take during pregnancy.  Exercise only as directed by your health care provider. Experiencing uterine cramps is a good sign to stop exercising.  Continue to eat regular, healthy meals.  Wear a good support bra for breast tenderness.  Do not use hot tubs, steam rooms, or saunas.  Wear your  seat belt at all times when driving.  Avoid raw meat, uncooked cheese, cat litter boxes, and soil used by cats. These carry germs that can cause birth defects in the baby.  Take your prenatal vitamins.  Try taking a stool softener (if your health care provider approves) if you develop constipation. Eat more high-fiber foods, such as fresh vegetables or fruit and whole grains. Drink plenty of fluids to keep your urine clear or pale yellow.  Take warm sitz baths to soothe any pain or discomfort caused by hemorrhoids. Use hemorrhoid cream if your health care provider approves.  If you develop varicose veins, wear support hose. Elevate your feet for 15 minutes, 3-4 times a day. Limit salt in your diet.  Avoid heavy lifting, wear low heel shoes, and practice good posture.  Rest with your legs elevated if you have leg cramps or low back pain.  Visit your dentist if you have not gone yet during your pregnancy. Use a soft toothbrush to brush your teeth and be gentle when you floss.  A sexual relationship may be continued unless your health care provider directs you otherwise.  Continue to go to all your prenatal visits as directed by your health care provider. SEEK MEDICAL CARE IF:   You have dizziness.  You have mild pelvic cramps, pelvic pressure, or nagging pain in the abdominal area.  You have persistent nausea, vomiting, or diarrhea.  You have a bad smelling vaginal discharge.  You have pain with urination. SEEK IMMEDIATE MEDICAL CARE IF:   You have a fever.  You are leaking fluid from your vagina.  You have spotting or bleeding from your vagina.  You have severe abdominal cramping or pain.  You have rapid weight gain or loss.  You have shortness of breath with chest pain.  You notice sudden or extreme swelling of your face, hands, ankles, feet, or legs.  You have not felt your baby move in over an hour.  You have severe headaches that do not go away with  medicine.  You have vision changes. Document Released: 12/31/2000 Document Revised: 01/11/2013 Document Reviewed: 03/09/2012 [employer] Patient Information 2015 [employer]. This information is not intended to replace advice given to you by your health care provider. Make sure you discuss any questions you have with your health care provider.  

## 2014-08-29 ENCOUNTER — Other Ambulatory Visit (HOSPITAL_COMMUNITY): Payer: Self-pay | Admitting: Maternal and Fetal Medicine

## 2014-08-29 DIAGNOSIS — O26872 Cervical shortening, second trimester: Secondary | ICD-10-CM

## 2014-08-29 DIAGNOSIS — O24919 Unspecified diabetes mellitus in pregnancy, unspecified trimester: Principal | ICD-10-CM

## 2014-08-29 DIAGNOSIS — Z3A27 27 weeks gestation of pregnancy: Secondary | ICD-10-CM

## 2014-08-29 DIAGNOSIS — O352XX0 Maternal care for (suspected) hereditary disease in fetus, not applicable or unspecified: Secondary | ICD-10-CM

## 2014-08-29 DIAGNOSIS — E119 Type 2 diabetes mellitus without complications: Secondary | ICD-10-CM

## 2014-09-06 ENCOUNTER — Ambulatory Visit (HOSPITAL_COMMUNITY)
Admission: RE | Admit: 2014-09-06 | Discharge: 2014-09-06 | Disposition: A | Discharge status: Home / Self Care | Payer: Medicaid Other | Source: Ambulatory Visit | Attending: Obstetrics and Gynecology | Admitting: Obstetrics and Gynecology

## 2014-09-06 ENCOUNTER — Encounter (HOSPITAL_COMMUNITY): Payer: Self-pay

## 2014-09-06 DIAGNOSIS — O352XX0 Maternal care for (suspected) hereditary disease in fetus, not applicable or unspecified: Secondary | ICD-10-CM | POA: Insufficient documentation

## 2014-09-06 DIAGNOSIS — O24919 Unspecified diabetes mellitus in pregnancy, unspecified trimester: Secondary | ICD-10-CM | POA: Insufficient documentation

## 2014-09-06 DIAGNOSIS — E119 Type 2 diabetes mellitus without complications: Secondary | ICD-10-CM | POA: Insufficient documentation

## 2014-09-06 DIAGNOSIS — O26872 Cervical shortening, second trimester: Secondary | ICD-10-CM | POA: Insufficient documentation

## 2014-09-11 ENCOUNTER — Ambulatory Visit (INDEPENDENT_AMBULATORY_CARE_PROVIDER_SITE_OTHER): Payer: Self-pay | Admitting: Obstetrics and Gynecology

## 2014-09-11 ENCOUNTER — Encounter: Payer: Self-pay | Admitting: Obstetrics and Gynecology

## 2014-09-11 VITALS — BP 111/71 | HR 97 | Temp 97.6°F | Wt 127.9 lb

## 2014-09-11 DIAGNOSIS — O24319 Unspecified pre-existing diabetes mellitus in pregnancy, unspecified trimester: Secondary | ICD-10-CM

## 2014-09-11 DIAGNOSIS — E119 Type 2 diabetes mellitus without complications: Secondary | ICD-10-CM

## 2014-09-11 DIAGNOSIS — O0992 Supervision of high risk pregnancy, unspecified, second trimester: Secondary | ICD-10-CM

## 2014-09-11 DIAGNOSIS — Z3492 Encounter for supervision of normal pregnancy, unspecified, second trimester: Secondary | ICD-10-CM

## 2014-09-11 DIAGNOSIS — N898 Other specified noninflammatory disorders of vagina: Secondary | ICD-10-CM

## 2014-09-11 DIAGNOSIS — Z23 Encounter for immunization: Secondary | ICD-10-CM

## 2014-09-11 LAB — CBC
HCT: 34.5 % — ABNORMAL LOW (ref 36.0–46.0)
Hemoglobin: 11.7 g/dL — ABNORMAL LOW (ref 12.0–15.0)
MCH: 28.4 pg (ref 26.0–34.0)
MCHC: 33.9 g/dL (ref 30.0–36.0)
MCV: 83.7 fL (ref 78.0–100.0)
MPV: 9.5 fL (ref 8.6–12.4)
PLATELETS: 299 10*3/uL (ref 150–400)
RBC: 4.12 MIL/uL (ref 3.87–5.11)
RDW: 13.7 % (ref 11.5–15.5)
WBC: 11.1 10*3/uL — ABNORMAL HIGH (ref 4.0–10.5)

## 2014-09-11 LAB — POCT URINALYSIS DIP (DEVICE)
Bilirubin Urine: NEGATIVE
Glucose, UA: 500 mg/dL — AB
Hgb urine dipstick: NEGATIVE
Nitrite: NEGATIVE
PROTEIN: NEGATIVE mg/dL
SPECIFIC GRAVITY, URINE: 1.02 (ref 1.005–1.030)
UROBILINOGEN UA: 0.2 mg/dL (ref 0.0–1.0)
pH: 6 (ref 5.0–8.0)

## 2014-09-11 MED ORDER — TETANUS-DIPHTH-ACELL PERTUSSIS 5-2.5-18.5 LF-MCG/0.5 IM SUSP
0.5000 mL | Freq: Once | INTRAMUSCULAR | Status: AC
  Administered 2014-09-11: 0.5 mL via INTRAMUSCULAR

## 2014-09-11 NOTE — Addendum Note (Signed)
Addended by: Aldona Lento on: 09/11/2014 09:13 AM   Modules accepted: Orders

## 2014-09-11 NOTE — Progress Notes (Signed)
Subjective:  Makayla Meyer is a 24 y.o. G2P0010 at [redacted]w[redacted]d being seen today for ongoing prenatal care.  Patient reports increased vaginal discharge and itching. Doesn't bring log. Once a week is fasting > 95. Says PPs never > 120. One instance of hypoglycemia this pregnancy. Marland Kitchennbwpre Contractions: Not present.  Vag. Bleeding: None. Movement: Present. Denies leaking of fluid.   The following portions of the patient's history were reviewed and updated as appropriate: allergies, current medications, past family history, past medical history, past social history, past surgical history and problem list.   Objective:   Filed Vitals:   09/11/14 0843  BP: 111/71  Pulse: 97  Temp: 97.6 F (36.4 C)  Weight: 127 lb 14.4 oz (58.015 kg)    Fetal Status: Fetal Heart Rate (bpm): 169 Fundal Height: 28 cm Movement: Present     General:  Alert, oriented and cooperative. Patient is in no acute distress.  Skin: Skin is warm and dry. No rash noted.   Cardiovascular: Normal heart rate noted  Respiratory: Normal respiratory effort, no problems with respiration noted  Abdomen: Soft, gravid, appropriate for gestational age. Pain/Pressure: Absent     Pelvic: Vag. Bleeding: None Vag D/C Character: Curdy   Cervical exam deferred        Extremities: Normal range of motion.  Edema: None  Mental Status: Normal mood and affect. Normal behavior. Normal judgment and thought content.   Urinalysis:      Assessment and Plan:  Pregnancy: G2P0010 at [redacted]w[redacted]d  1. Prenatal care in second trimester - continue pnv - 28-week labs and tdap at next visit  3. Vaginal discharge - Wet prep, genital  4. BDM - doesn't bring logs, but recollection suggests good control. Reinforced bringing [place] log - ophtho referral - hasn't yet seen this pregnancy - has growth scan scheduled 8/31  5. Cervical insufficiency - continue vaginal progesterone  Preterm labor symptoms and general obstetric precautions including but not limited to  vaginal bleeding, contractions, leaking of fluid and fetal movement were reviewed in detail with the patient. Please refer to After Visit Summary for other counseling recommendations.  Return in about 2 weeks (around 09/25/2014).   Kathrynn Running, MD

## 2014-09-11 NOTE — Progress Notes (Signed)
Breastfeeding tip of the week removed Pt thinks she has a yeast infection 28 wk labs Declined Flu vaccine Tdap vaccine given

## 2014-09-12 LAB — HIV ANTIBODY (ROUTINE TESTING W REFLEX): HIV 1&2 Ab, 4th Generation: NONREACTIVE

## 2014-09-12 LAB — RPR

## 2014-09-12 LAB — WET PREP, GENITAL
Clue Cells Wet Prep HPF POC: NONE SEEN
Trich, Wet Prep: NONE SEEN

## 2014-09-20 ENCOUNTER — Inpatient Hospital Stay (HOSPITAL_COMMUNITY)
Admission: AD | Admit: 2014-09-20 | Discharge: 2014-09-20 | Disposition: A | Discharge status: Home / Self Care | Payer: Medicaid Other | Source: Ambulatory Visit | Attending: Obstetrics & Gynecology | Admitting: Obstetrics & Gynecology

## 2014-09-20 ENCOUNTER — Other Ambulatory Visit (HOSPITAL_COMMUNITY): Payer: Self-pay | Admitting: Maternal and Fetal Medicine

## 2014-09-20 ENCOUNTER — Ambulatory Visit (HOSPITAL_COMMUNITY)
Admission: RE | Admit: 2014-09-20 | Discharge: 2014-09-20 | Disposition: A | Discharge status: Home / Self Care | Payer: Medicaid Other | Source: Ambulatory Visit | Attending: Maternal and Fetal Medicine | Admitting: Maternal and Fetal Medicine

## 2014-09-20 ENCOUNTER — Ambulatory Visit (HOSPITAL_COMMUNITY): Payer: Medicaid Other

## 2014-09-20 ENCOUNTER — Encounter (HOSPITAL_COMMUNITY): Payer: Self-pay

## 2014-09-20 DIAGNOSIS — E119 Type 2 diabetes mellitus without complications: Secondary | ICD-10-CM | POA: Insufficient documentation

## 2014-09-20 DIAGNOSIS — O24919 Unspecified diabetes mellitus in pregnancy, unspecified trimester: Principal | ICD-10-CM

## 2014-09-20 DIAGNOSIS — Z7982 Long term (current) use of aspirin: Secondary | ICD-10-CM | POA: Insufficient documentation

## 2014-09-20 DIAGNOSIS — O26872 Cervical shortening, second trimester: Secondary | ICD-10-CM | POA: Diagnosis not present

## 2014-09-20 DIAGNOSIS — Z3A27 27 weeks gestation of pregnancy: Secondary | ICD-10-CM | POA: Diagnosis not present

## 2014-09-20 DIAGNOSIS — O352XX0 Maternal care for (suspected) hereditary disease in fetus, not applicable or unspecified: Secondary | ICD-10-CM

## 2014-09-20 DIAGNOSIS — O4702 False labor before 37 completed weeks of gestation, second trimester: Secondary | ICD-10-CM | POA: Diagnosis not present

## 2014-09-20 DIAGNOSIS — O24112 Pre-existing diabetes mellitus, type 2, in pregnancy, second trimester: Secondary | ICD-10-CM | POA: Insufficient documentation

## 2014-09-20 DIAGNOSIS — O26879 Cervical shortening, unspecified trimester: Secondary | ICD-10-CM | POA: Diagnosis present

## 2014-09-20 DIAGNOSIS — O402XX1 Polyhydramnios, second trimester, fetus 1: Secondary | ICD-10-CM

## 2014-09-20 LAB — URINALYSIS, ROUTINE W REFLEX MICROSCOPIC
BILIRUBIN URINE: NEGATIVE
Glucose, UA: >1000 mg/dL — AB
Ketones, ur: 15 mg/dL — AB
Leukocytes, UA: NEGATIVE
Nitrite: NEGATIVE
PH: 6 (ref 5.0–8.0)
Protein, ur: NEGATIVE mg/dL
Specific Gravity, Urine: 1.015 (ref 1.005–1.030)
Urobilinogen, UA: 0.2 mg/dL (ref 0.0–1.0)

## 2014-09-20 LAB — GLUCOSE, CAPILLARY: Glucose-Capillary: 248 mg/dL — ABNORMAL HIGH (ref 65–99)

## 2014-09-20 LAB — URINE MICROSCOPIC-ADD ON

## 2014-09-20 MED ORDER — BETAMETHASONE SOD PHOS & ACET 6 (3-3) MG/ML IJ SUSP
12.0000 mg | Freq: Once | INTRAMUSCULAR | Status: AC
  Administered 2014-09-20: 12 mg via INTRAMUSCULAR
  Filled 2014-09-20: qty 2

## 2014-09-20 MED ORDER — METFORMIN HCL 500 MG PO TABS: 500.0000 mg | ORAL_TABLET | Freq: Two times a day (BID) | ORAL | Status: DC

## 2014-09-20 NOTE — MAU Provider Note (Signed)
History     CSN: [id number]  Arrival date and time: 09/20/14 1547   First Provider Initiated Contact with Patient 09/20/14 1645      Chief Complaint  Patient presents with  . Contractions   HPI  Makayla Meyer is a 24 y.o. G2P0010 at 39w6dwho presents from MFM for cervical exam & monitoring. Pt is being followed by MFM for short cervix; was 2.2 cm at 23 wks, is 1.8 cm today. Was sent per Dr. QLeonides Sake(MFM), pt to have cervical exam & to start steroids.  Patient feels intermittent abdominal tightening but denies pain with contractions; cannot quantify frequency of contractions.  Denies LOF or vaginal bleeding.  Positive fetal movement.   Pt is type 2 DM on metformin & glyburide. Has not taken metformin in the last several days b/c she's out of it & is waiting on refill from her doctor. Took glyburide late today.     Past Medical History  Diagnosis Date  . Diabetes mellitus without complication   . UTI (lower urinary tract infection)     Past Surgical History  Procedure Laterality Date  . No past surgeries      Family History  Problem Relation Age of Onset  . Hypertension Mother   . Asthma Mother   . Diabetes Mother   . Depression Mother   . Diabetes Sister     Social History  Substance Use Topics  . Smoking status: Never Smoker   . Smokeless tobacco: Never Used  . Alcohol Use: Yes     Comment: before pregnancy    Allergies: No Known Allergies  Prescriptions prior to admission  Medication Sig Dispense Refill Last Dose  . ACCU-CHEK FASTCLIX LANCETS MISC 1 Units by Does not apply route 4 (four) times daily. 102 each 3 Taking  . aspirin 81 MG chewable tablet Chew 1 tablet (81 mg total) by mouth daily. 90 tablet 3 09/20/2014 at 1630  . Blood Glucose Monitoring Suppl (ACCU-CHEK NANO SMARTVIEW) W/DEVICE KIT 1 Device by Does not apply route 4 (four) times daily. 1 kit 0 Taking  . glucose blood (ACCU-CHEK SMARTVIEW) test strip 1 strip four times daily 51 each 12 Taking  .  glyBURIDE (DIABETA) 2.5 MG tablet Take 1 tablet (2.5 mg total) by mouth 2 (two) times daily with a meal. 60 tablet 3 09/20/2014 at 1630  . metFORMIN (GLUCOPHAGE) 500 MG tablet Take 1 tablet (500 mg total) by mouth 2 (two) times daily with a meal. 60 tablet 1 09/20/2014 at 1630  . prenatal vitamin w/FE, FA (PRENATAL 1 + 1) 27-1 MG TABS tablet Take 1 tablet by mouth daily at 12 noon. (Patient taking differently: Take 1 tablet by mouth daily. ) 30 each 10 09/19/2014 at Unknown time  . progesterone (PROMETRIUM) 200 MG capsule Place 200 mg vaginally daily.   09/19/2014 at Unknown time  . glucose blood test strip One Touch ultra Mini test strips for TS2GBcomplicated by pregnancy O24.119 for testing 4 times daily (Patient not taking: Reported on 09/20/2014) 100 each 12 Not Taking at Unknown time  . ONE TOUCH LANCETS MISC 1 each by Does not apply route 4 (four) times daily. Lancets for One Touch Delica for testing 4 times daily DX O24.419 (Patient not taking: Reported on 09/20/2014) 100 each 12 Not Taking at Unknown time    Review of Systems  Constitutional: Negative.   HENT: Negative.   Eyes: Negative for blurred vision.  Respiratory: Negative.   Cardiovascular: Negative.   Gastrointestinal: Negative.  Genitourinary: Negative.    Physical Exam   Blood pressure 108/59, pulse 98, temperature 98.2 F (36.8 C), temperature source Oral, resp. rate 16, height 5' 1" (1.549 m), last menstrual period 03/09/2014, SpO2 100 %.  Physical Exam  Nursing note and vitals reviewed. Constitutional: She is oriented to person, place, and time. She appears well-developed and well-nourished. No distress.  HENT:  Head: Normocephalic and atraumatic.  Eyes: Conjunctivae are normal. Right eye exhibits no discharge. Left eye exhibits no discharge. No scleral icterus.  Neck: Normal range of motion.  Cardiovascular: Normal rate, regular rhythm and normal heart sounds.   No murmur heard. Respiratory: Effort normal and breath  sounds normal. No respiratory distress. She has no wheezes.  GI: Soft. Bowel sounds are normal. She exhibits distension (appropriate for gestation). There is no tenderness.  Neurological: She is alert and oriented to person, place, and time.  Skin: Skin is warm and dry. She is not diaphoretic.  Psychiatric: She has a normal mood and affect. Her behavior is normal. Judgment and thought content normal.   Dilation: Closed Effacement (%): 70 Station: -3 Exam by:: Jorje Guild NP/Jennifer Rasch NP  Fetal Tracing:  Baseline: 150 Variability: moderate Accelerations: 15x15 Decelerations: none  Toco: UI & irregular ctx 2-5           30-60 sec           Palpate mild   MAU Course  Procedures Results for orders placed or performed during the hospital encounter of 09/20/14 (from the past 24 hour(s))  Urinalysis, Routine w reflex microscopic (not at [facility])     Status: Abnormal   Collection Time: 09/20/14  3:55 PM  Result Value Ref Range   Color, Urine YELLOW YELLOW   APPearance CLEAR CLEAR   Specific Gravity, Urine 1.015 1.005 - 1.030   pH 6.0 5.0 - 8.0   Glucose, UA >1000 (A) NEGATIVE mg/dL   Hgb urine dipstick TRACE (A) NEGATIVE   Bilirubin Urine NEGATIVE NEGATIVE   Ketones, ur 15 (A) NEGATIVE mg/dL   Protein, ur NEGATIVE NEGATIVE mg/dL   Urobilinogen, UA 0.2 0.0 - 1.0 mg/dL   Nitrite NEGATIVE NEGATIVE   Leukocytes, UA NEGATIVE NEGATIVE  Urine microscopic-add on     Status: Abnormal   Collection Time: 09/20/14  3:55 PM  Result Value Ref Range   Squamous Epithelial / LPF FEW (A) RARE   WBC, UA 0-2 <3 WBC/hpf  Glucose, capillary     Status: Abnormal   Collection Time: 09/20/14  4:15 PM  Result Value Ref Range   Glucose-Capillary 248 (H) 65 - 99 mg/dL   Comment 1 Document in Chart    BPP 8/8 in MFM   MDM 1725- C/w Dr. Elonda Husky. Informed of contractions, SVE, & CBG. Give BMZ & discharge home. Refill metformin.  Cervix closed Pt in no pain during this visit Category 1  tracing 1st dose of BMZ given Assessment and Plan  A: 1. Cervical shortening affecting pregnancy, antepartum, second trimester   2. Preterm contractions, second trimester    P: Discharge home in stable condition Discussed reasons to return to MAU; strong preterm labor precautions discussed Return to MAU tomorrow for 2nd BMZ injection Rx for metformin; discussed importance of taking diabetes meds as prescribed, especially while getting steroids  Jorje Guild, NP  09/20/2014, 5:27 PM

## 2014-09-20 NOTE — MAU Note (Signed)
Patient was seen today in MFM for [place] that showed a shortened cervix from 2.2 down to 1.8 per patient.  Denies pain or contractions.  No LOF or vaginal bleeding.  Reports +fetal movement.  Here for monitoring and BMZ per MFM RN.

## 2014-09-20 NOTE — ED Notes (Signed)
Pt taken to MAU for monitoring and betamethasone.  Report to Elnita Maxwell, RN charge.

## 2014-09-20 NOTE — Discharge Instructions (Signed)
Preterm Labor Information Preterm labor is when labor starts at less than 37 weeks of pregnancy. The normal length of a pregnancy is 39 to 41 weeks. CAUSES Often, there is no identifiable underlying cause as to why a woman goes into preterm labor. One of the most common known causes of preterm labor is infection. Infections of the uterus, cervix, vagina, amniotic sac, bladder, kidney, or even the lungs (pneumonia) can cause labor to start. Other suspected causes of preterm labor include:   Urogenital infections, such as yeast infections and bacterial vaginosis.   Uterine abnormalities (uterine shape, uterine septum, fibroids, or bleeding from the placenta).   A cervix that has been operated on (it may fail to stay closed).   Malformations in the fetus.   Multiple gestations (twins, triplets, and so on).   Breakage of the amniotic sac.  RISK FACTORS  Having a previous history of preterm labor.   Having premature rupture of membranes (PROM).   Having a placenta that covers the opening of the cervix (placenta previa).   Having a placenta that separates from the uterus (placental abruption).   Having a cervix that is too weak to hold the fetus in the uterus (incompetent cervix).   Having too much fluid in the amniotic sac (polyhydramnios).   Taking illegal drugs or smoking while pregnant.   Not gaining enough weight while pregnant.   Being younger than 8 and older than 25 years old.   Having a low socioeconomic status.   Being African American. SYMPTOMS Signs and symptoms of preterm labor include:   Menstrual-like cramps, abdominal pain, or back pain.  Uterine contractions that are regular, as frequent as six in an hour, regardless of their intensity (may be mild or painful).  Contractions that start on the top of the uterus and spread down to the lower abdomen and back.   A sense of increased pelvic pressure.   A watery or bloody mucus discharge that  comes from the vagina.  TREATMENT Depending on the length of the pregnancy and other circumstances, your health care provider may suggest bed rest. If necessary, there are medicines that can be given to stop contractions and to mature the fetal lungs. If labor happens before 34 weeks of pregnancy, a prolonged hospital stay may be recommended. Treatment depends on the condition of both you and the fetus.  WHAT SHOULD YOU DO IF YOU THINK YOU ARE IN PRETERM LABOR? Call your health care provider right away. You will need to go to the hospital to get checked immediately. HOW CAN YOU PREVENT PRETERM LABOR IN FUTURE PREGNANCIES? You should:   Stop smoking if you smoke.  Maintain healthy weight gain and avoid chemicals and drugs that are not necessary.  Be watchful for any type of infection.  Inform your health care provider if you have a known history of preterm labor. Document Released: 03/29/2003 Document Revised: 09/08/2012 Document Reviewed: 02/09/2012 [facility] Patient Information 2015 [place], [place]. This information is not intended to replace advice given to you by your health care provider. Make sure you discuss any questions you have with your health care provider.    Basic Carbohydrate Counting for Diabetes Mellitus Carbohydrate counting is a method for keeping track of the amount of carbohydrates you eat. Eating carbohydrates naturally increases the level of sugar (glucose) in your blood, so it is important for you to know the amount that is okay for you to have in every meal. Carbohydrate counting helps keep the level of glucose in  your blood within normal limits. The amount of carbohydrates allowed is different for every person. A dietitian can help you calculate the amount that is right for you. Once you know the amount of carbohydrates you can have, you can count the carbohydrates in the foods you want to eat. Carbohydrates are found in the following foods:  Grains, such as breads  and cereals.  Dried beans and soy products.  Starchy vegetables, such as potatoes, peas, and corn.  Fruit and fruit juices.  Milk and yogurt.  Sweets and snack foods, such as cake, cookies, candy, chips, soft drinks, and fruit drinks. CARBOHYDRATE COUNTING There are two ways to count the carbohydrates in your food. You can use either of the methods or a combination of both. Reading the "Nutrition Facts" on Packaged Food The "Nutrition Facts" is an area that is included on the labels of almost all packaged food and beverages in the [place]. It includes the serving size of that food or beverage and information about the nutrients in each serving of the food, including the grams (g) of carbohydrate per serving.  Decide the number of servings of this food or beverage that you will be able to eat or drink. Multiply that number of servings by the number of grams of carbohydrate that is listed on the label for that serving. The total will be the amount of carbohydrates you will be having when you eat or drink this food or beverage. Learning Standard Serving Sizes of Food When you eat food that is not packaged or does not include "Nutrition Facts" on the label, you need to measure the servings in order to count the amount of carbohydrates.A serving of most carbohydrate-rich foods contains about 15 g of carbohydrates. The following list includes serving sizes of carbohydrate-rich foods that provide 15 g ofcarbohydrate per serving:   1 slice of bread (1 oz) or 1 six-inch tortilla.    of a hamburger bun or English muffin.  4-6 crackers.   cup unsweetened dry cereal.    cup hot cereal.   cup rice or pasta.    cup mashed potatoes or  of a large baked potato.  1 cup fresh fruit or one small piece of fruit.    cup canned or frozen fruit or fruit juice.  1 cup milk.   cup plain fat-free yogurt or yogurt sweetened with artificial sweeteners.   cup cooked dried beans or  starchy vegetable, such as peas, corn, or potatoes.  Decide the number of standard-size servings that you will eat. Multiply that number of servings by 15 (the grams of carbohydrates in that serving). For example, if you eat 2 cups of strawberries, you will have eaten 2 servings and 30 g of carbohydrates (2 servings x 15 g = 30 g). For foods such as soups and casseroles, in which more than one food is mixed in, you will need to count the carbohydrates in each food that is included. EXAMPLE OF CARBOHYDRATE COUNTING Sample Dinner  3 oz chicken breast.   cup of brown rice.   cup of corn.  1 cup milk.   1 cup strawberries with sugar-free whipped topping.  Carbohydrate Calculation Step 1: Identify the foods that contain carbohydrates:   Rice.   Corn.   Milk.   Strawberries. Step 2:Calculate the number of servings eaten of each:   2 servings of rice.   1 serving of corn.   1 serving of milk.   1 serving of strawberries. Step 3:  Multiply each of those number of servings by 15 g:   2 servings of rice x 15 g = 30 g.   1 serving of corn x 15 g = 15 g.   1 serving of milk x 15 g = 15 g.   1 serving of strawberries x 15 g = 15 g. Step 4: Add together all of the amounts to find the total grams of carbohydrates eaten: 30 g + 15 g + 15 g + 15 g = 75 g. Document Released: 01/06/2005 Document Revised: 05/23/2013 Document Reviewed: 12/03/2012 [facility] Patient Information 2015 [place], [place]. This information is not intended to replace advice given to you by your health care provider. Make sure you discuss any questions you have with your health care provider.

## 2014-09-21 ENCOUNTER — Inpatient Hospital Stay (HOSPITAL_COMMUNITY)
Admission: AD | Admit: 2014-09-21 | Discharge: 2014-09-24 | DRG: 781 | Disposition: A | Discharge status: Home / Self Care | Payer: Medicaid Other | Source: Ambulatory Visit | Attending: Family Medicine | Admitting: Family Medicine

## 2014-09-21 ENCOUNTER — Telehealth: Payer: Self-pay | Admitting: *Deleted

## 2014-09-21 ENCOUNTER — Encounter (HOSPITAL_COMMUNITY): Payer: Self-pay

## 2014-09-21 DIAGNOSIS — O26873 Cervical shortening, third trimester: Principal | ICD-10-CM | POA: Diagnosis present

## 2014-09-21 DIAGNOSIS — O0992 Supervision of high risk pregnancy, unspecified, second trimester: Secondary | ICD-10-CM

## 2014-09-21 DIAGNOSIS — N760 Acute vaginitis: Secondary | ICD-10-CM | POA: Diagnosis present

## 2014-09-21 DIAGNOSIS — O24113 Pre-existing diabetes mellitus, type 2, in pregnancy, third trimester: Secondary | ICD-10-CM | POA: Diagnosis present

## 2014-09-21 DIAGNOSIS — O26879 Cervical shortening, unspecified trimester: Secondary | ICD-10-CM | POA: Diagnosis present

## 2014-09-21 DIAGNOSIS — Z833 Family history of diabetes mellitus: Secondary | ICD-10-CM

## 2014-09-21 DIAGNOSIS — E871 Hypo-osmolality and hyponatremia: Secondary | ICD-10-CM | POA: Diagnosis present

## 2014-09-21 DIAGNOSIS — O24919 Unspecified diabetes mellitus in pregnancy, unspecified trimester: Secondary | ICD-10-CM | POA: Diagnosis not present

## 2014-09-21 DIAGNOSIS — O26872 Cervical shortening, second trimester: Secondary | ICD-10-CM

## 2014-09-21 DIAGNOSIS — Z8249 Family history of ischemic heart disease and other diseases of the circulatory system: Secondary | ICD-10-CM

## 2014-09-21 DIAGNOSIS — O99820 Streptococcus B carrier state complicating pregnancy: Secondary | ICD-10-CM

## 2014-09-21 DIAGNOSIS — E1165 Type 2 diabetes mellitus with hyperglycemia: Secondary | ICD-10-CM | POA: Diagnosis present

## 2014-09-21 DIAGNOSIS — A499 Bacterial infection, unspecified: Secondary | ICD-10-CM

## 2014-09-21 DIAGNOSIS — Z3A28 28 weeks gestation of pregnancy: Secondary | ICD-10-CM | POA: Diagnosis present

## 2014-09-21 DIAGNOSIS — O23593 Infection of other part of genital tract in pregnancy, third trimester: Secondary | ICD-10-CM | POA: Diagnosis present

## 2014-09-21 DIAGNOSIS — B9689 Other specified bacterial agents as the cause of diseases classified elsewhere: Secondary | ICD-10-CM | POA: Diagnosis present

## 2014-09-21 LAB — OB RESULTS CONSOLE GBS: GBS: POSITIVE

## 2014-09-21 LAB — URINALYSIS, ROUTINE W REFLEX MICROSCOPIC
BILIRUBIN URINE: NEGATIVE
GLUCOSE, UA: 500 mg/dL — AB
Ketones, ur: >80 mg/dL — AB
Leukocytes, UA: NEGATIVE
Nitrite: NEGATIVE
PH: 5.5 (ref 5.0–8.0)
Protein, ur: NEGATIVE mg/dL
UROBILINOGEN UA: 0.2 mg/dL (ref 0.0–1.0)

## 2014-09-21 LAB — CBC
HEMATOCRIT: 33.7 % — AB (ref 36.0–46.0)
Hemoglobin: 11.6 g/dL — ABNORMAL LOW (ref 12.0–15.0)
MCH: 28.6 pg (ref 26.0–34.0)
MCHC: 34.4 g/dL (ref 30.0–36.0)
MCV: 83.2 fL (ref 78.0–100.0)
PLATELETS: 282 10*3/uL (ref 150–400)
RBC: 4.05 MIL/uL (ref 3.87–5.11)
RDW: 13.6 % (ref 11.5–15.5)
WBC: 15.6 10*3/uL — AB (ref 4.0–10.5)

## 2014-09-21 LAB — GLUCOSE, CAPILLARY: GLUCOSE-CAPILLARY: 166 mg/dL — AB (ref 65–99)

## 2014-09-21 LAB — COMPREHENSIVE METABOLIC PANEL
ALT: 31 U/L (ref 14–54)
AST: 22 U/L (ref 15–41)
Albumin: 3.4 g/dL — ABNORMAL LOW (ref 3.5–5.0)
Alkaline Phosphatase: 72 U/L (ref 38–126)
Anion gap: 15 (ref 5–15)
BILIRUBIN TOTAL: 0.5 mg/dL (ref 0.3–1.2)
BUN: 13 mg/dL (ref 6–20)
CO2: 16 mmol/L — ABNORMAL LOW (ref 22–32)
CREATININE: 0.55 mg/dL (ref 0.44–1.00)
Calcium: 9.7 mg/dL (ref 8.9–10.3)
Chloride: 100 mmol/L — ABNORMAL LOW (ref 101–111)
Glucose, Bld: 182 mg/dL — ABNORMAL HIGH (ref 65–99)
POTASSIUM: 4.4 mmol/L (ref 3.5–5.1)
Sodium: 131 mmol/L — ABNORMAL LOW (ref 135–145)
TOTAL PROTEIN: 7.3 g/dL (ref 6.5–8.1)

## 2014-09-21 LAB — TYPE AND SCREEN
ABO/RH(D): A POS
ANTIBODY SCREEN: NEGATIVE

## 2014-09-21 LAB — WET PREP, GENITAL
Trich, Wet Prep: NONE SEEN
YEAST WET PREP: NONE SEEN

## 2014-09-21 LAB — URINE MICROSCOPIC-ADD ON

## 2014-09-21 LAB — ABO/RH: ABO/RH(D): A POS

## 2014-09-21 MED ORDER — NIFEDIPINE 10 MG PO CAPS
10.0000 mg | ORAL_CAPSULE | ORAL | Status: AC
  Administered 2014-09-21 (×3): 10 mg via ORAL
  Filled 2014-09-21 (×3): qty 1

## 2014-09-21 MED ORDER — ZOLPIDEM TARTRATE 5 MG PO TABS: 5.0000 mg | ORAL_TABLET | Freq: Every evening | ORAL | Status: DC | PRN

## 2014-09-21 MED ORDER — PRENATAL MULTIVITAMIN CH
1.0000 | ORAL_TABLET | Freq: Every day | ORAL | Status: DC
  Administered 2014-09-23: 1 via ORAL
  Filled 2014-09-21 (×4): qty 1

## 2014-09-21 MED ORDER — DOCUSATE SODIUM 100 MG PO CAPS
100.0000 mg | ORAL_CAPSULE | Freq: Every day | ORAL | Status: DC
  Administered 2014-09-22 – 2014-09-24 (×3): 100 mg via ORAL
  Filled 2014-09-21 (×5): qty 1

## 2014-09-21 MED ORDER — METRONIDAZOLE 500 MG PO TABS
500.0000 mg | ORAL_TABLET | Freq: Two times a day (BID) | ORAL | Status: DC
Start: 2014-09-21 — End: 2014-09-24
  Administered 2014-09-21 – 2014-09-24 (×6): 500 mg via ORAL
  Filled 2014-09-21 (×8): qty 1

## 2014-09-21 MED ORDER — ACETAMINOPHEN 325 MG PO TABS: 650.0000 mg | ORAL_TABLET | ORAL | Status: DC | PRN

## 2014-09-21 MED ORDER — ASPIRIN EC 81 MG PO TBEC
81.0000 mg | DELAYED_RELEASE_TABLET | Freq: Every day | ORAL | Status: DC
  Administered 2014-09-22 – 2014-09-24 (×3): 81 mg via ORAL
  Filled 2014-09-21 (×4): qty 1

## 2014-09-21 MED ORDER — BETAMETHASONE SOD PHOS & ACET 6 (3-3) MG/ML IJ SUSP
12.0000 mg | Freq: Once | INTRAMUSCULAR | Status: AC
  Administered 2014-09-21: 12 mg via INTRAMUSCULAR
  Filled 2014-09-21: qty 2

## 2014-09-21 MED ORDER — PROGESTERONE MICRONIZED 200 MG PO CAPS
200.0000 mg | ORAL_CAPSULE | Freq: Every day | ORAL | Status: DC
  Administered 2014-09-21 – 2014-09-23 (×3): 200 mg via VAGINAL
  Filled 2014-09-21 (×3): qty 1

## 2014-09-21 MED ORDER — LACTATED RINGERS IV SOLN
INTRAVENOUS | Status: DC
  Administered 2014-09-21 – 2014-09-23 (×5): via INTRAVENOUS

## 2014-09-21 MED ORDER — GLYBURIDE 2.5 MG PO TABS
2.5000 mg | ORAL_TABLET | Freq: Two times a day (BID) | ORAL | Status: DC
  Administered 2014-09-22 – 2014-09-24 (×5): 2.5 mg via ORAL
  Filled 2014-09-21 (×7): qty 1

## 2014-09-21 MED ORDER — METFORMIN HCL 500 MG PO TABS
500.0000 mg | ORAL_TABLET | Freq: Two times a day (BID) | ORAL | Status: DC
  Administered 2014-09-22 – 2014-09-24 (×5): 500 mg via ORAL
  Filled 2014-09-21 (×7): qty 1

## 2014-09-21 MED ORDER — SODIUM CHLORIDE 0.9 % IV BOLUS (SEPSIS)
1000.0000 mL | Freq: Once | INTRAVENOUS | Status: AC
  Administered 2014-09-21: 1000 mL via INTRAVENOUS

## 2014-09-21 MED ORDER — MAGNESIUM SULFATE BOLUS VIA INFUSION
4.0000 g | Freq: Once | INTRAVENOUS | Status: AC
  Administered 2014-09-21: 4 g via INTRAVENOUS
  Filled 2014-09-21: qty 500

## 2014-09-21 MED ORDER — DEXTROSE-NACL 5-0.45 % IV SOLN: INTRAVENOUS | Status: DC

## 2014-09-21 MED ORDER — CALCIUM CARBONATE ANTACID 500 MG PO CHEW
2.0000 | CHEWABLE_TABLET | ORAL | Status: DC | PRN
  Administered 2014-09-21: 400 mg via ORAL
  Filled 2014-09-21 (×2): qty 2

## 2014-09-21 MED ORDER — MAGNESIUM SULFATE 50 % IJ SOLN
3.0000 g/h | INTRAVENOUS | Status: DC
  Administered 2014-09-22: 3 g/h via INTRAVENOUS
  Filled 2014-09-21 (×2): qty 80

## 2014-09-21 NOTE — MAU Provider Note (Signed)
MAU HISTORY AND PHYSICAL  Chief Complaint:  Contractions   Makayla Meyer is a 24 y.o.  G2P0010 with IUP at 70w0dpresenting for Contractions  Patient has hx poorly-controlled pregestational diabetes and cervical insufficiency. At 23 weeks her cervix measured 2.2 cm. On f/u 8/31 her cervix measured 1.8 and so she was sent to our MAU for evaluation and betamethazone. Cervix was closed at that check and she received BMZ. She returns today for her second dose and notes that beginning 2 PM today she has had moderate severeity contractions every 4 or so minutes. No leakage of fluid or contractions. No headache. No fever or chills or dysuria. Positive fetal movement.  Menstrual History: OB History    Gravida Para Term Preterm AB TAB SAB Ectopic Multiple Living   _0 Patient's last menstrual period was 03/09/2014 (exact date).      Past Medical History  Diagnosis Date  . Diabetes mellitus without complication   . UTI (lower urinary tract infection)     Past Surgical History  Procedure Laterality Date  . No past surgeries      Family History  Problem Relation Age of Onset  . Hypertension Mother   . Asthma Mother   . Diabetes Mother   . Depression Mother   . Diabetes Sister     Social History  Substance Use Topics  . Smoking status: Never Smoker   . Smokeless tobacco: Never Used  . Alcohol Use: Yes     Comment: before pregnancy     No Known Allergies  Prescriptions prior to admission  Medication Sig Dispense Refill Last Dose  . aspirin 81 MG chewable tablet Chew 1 tablet (81 mg total) by mouth daily. 90 tablet 3 09/21/2014 at Unknown time  . glyBURIDE (DIABETA) 2.5 MG tablet Take 1 tablet (2.5 mg total) by mouth 2 (two) times daily with a meal. 60 tablet 3 09/21/2014 at Unknown time  . metFORMIN (GLUCOPHAGE) 500 MG tablet Take 1 tablet (500 mg total) by mouth 2 (two) times daily with a meal. 60 tablet 0 09/21/2014 at Unknown time  . prenatal vitamin w/FE, FA (PRENATAL  1 + 1) 27-1 MG TABS tablet Take 1 tablet by mouth daily at 12 noon. (Patient taking differently: Take 1 tablet by mouth daily. ) 30 each 10 09/20/2014 at Unknown time  . progesterone (PROMETRIUM) 200 MG capsule Place 200 mg vaginally at bedtime.    09/20/2014 at Unknown time  . ACCU-CHEK FASTCLIX LANCETS MISC 1 Units by Does not apply route 4 (four) times daily. 102 each 3 Taking  . Blood Glucose Monitoring Suppl (ACCU-CHEK NANO SMARTVIEW) W/DEVICE KIT 1 Device by Does not apply route 4 (four) times daily. 1 kit 0 Taking  . glucose blood (ACCU-CHEK SMARTVIEW) test strip 1 strip four times daily 51 each 12 Taking    Review of Systems - Negative except for what is mentioned in HPI.  Physical Exam  Blood pressure 116/51, pulse 94, temperature 97.7 F (36.5 C), temperature source Oral, resp. rate 18, last menstrual period 03/09/2014. GENERAL: Well-developed, well-nourished female in no acute distress.  LUNGS: Clear to auscultation bilaterally.  HEART: Regular rate and rhythm. ABDOMEN: Soft, nontender, nondistended, gravid.  EXTREMITIES: Nontender, no edema, 2+ distal pulses. Cervical Exam: not dilated per NP exam FHT:  150/mod/+a/-d Contractions: q 4-5 min   Labs: Results for orders placed or performed during the hospital encounter of 09/21/14 (from the past  24 hour(s))  Urinalysis, Routine w reflex microscopic (not at [facility])   Collection Time: 09/21/14  3:25 PM  Result Value Ref Range   Color, Urine YELLOW YELLOW   APPearance HAZY (A) CLEAR   Specific Gravity, Urine >1.030 (H) 1.005 - 1.030   pH 5.5 5.0 - 8.0   Glucose, UA 500 (A) NEGATIVE mg/dL   Hgb urine dipstick TRACE (A) NEGATIVE   Bilirubin Urine NEGATIVE NEGATIVE   Ketones, ur >80 (A) NEGATIVE mg/dL   Protein, ur NEGATIVE NEGATIVE mg/dL   Urobilinogen, UA 0.2 0.0 - 1.0 mg/dL   Nitrite NEGATIVE NEGATIVE   Leukocytes, UA NEGATIVE NEGATIVE  Urine microscopic-add on   Collection Time: 09/21/14  3:25 PM  Result Value Ref Range    Squamous Epithelial / LPF FEW (A) RARE   WBC, UA 0-2 <3 WBC/hpf  CBC   Collection Time: 09/21/14  4:09 PM  Result Value Ref Range   WBC 15.6 (H) 4.0 - 10.5 K/uL   RBC 4.05 3.87 - 5.11 MIL/uL   Hemoglobin 11.6 (L) 12.0 - 15.0 g/dL   HCT 33.7 (L) 36.0 - 46.0 %   MCV 83.2 78.0 - 100.0 fL   MCH 28.6 26.0 - 34.0 pg   MCHC 34.4 30.0 - 36.0 g/dL   RDW 13.6 11.5 - 15.5 %   Platelets 282 150 - 400 K/uL  Comprehensive metabolic panel   Collection Time: 09/21/14  4:09 PM  Result Value Ref Range   Sodium 131 (L) 135 - 145 mmol/L   Potassium 4.4 3.5 - 5.1 mmol/L   Chloride 100 (L) 101 - 111 mmol/L   CO2 16 (L) 22 - 32 mmol/L   Glucose, Bld 182 (H) 65 - 99 mg/dL   BUN 13 6 - 20 mg/dL   Creatinine, Ser 0.55 0.44 - 1.00 mg/dL   Calcium 9.7 8.9 - 10.3 mg/dL   Total Protein 7.3 6.5 - 8.1 g/dL   Albumin 3.4 (L) 3.5 - 5.0 g/dL   AST 22 15 - 41 U/L   ALT 31 14 - 54 U/L   Alkaline Phosphatase 72 38 - 126 U/L   Total Bilirubin 0.5 0.3 - 1.2 mg/dL   GFR calc non Af Amer >60 >60 mL/min   GFR calc Af Amer >60 >60 mL/min   Anion gap 15 5 - 15  Glucose, capillary   Collection Time: 09/21/14  4:12 PM  Result Value Ref Range   Glucose-Capillary 166 (H) 65 - 99 mg/dL    Imaging Studies:  [place] Mfm Ob Transvaginal  09/20/2014   OBSTETRICAL ULTRASOUND: This exam was performed within a [place] [facility] Department. The OB US report was generated in the AS system, and faxed to the ordering physician.   This report is available in the [facility]. See the AS Obstetric US report via the Image Link.  [place] Mfm Ob Transvaginal  09/06/2014   OBSTETRICAL ULTRASOUND: This exam was performed within a [place] Ultrasound Department. The OB US report was generated in the AS system, and faxed to the ordering physician.   This report is available in the [facility]. See the AS Obstetric US report via the Image Link.  [place] Mfm Fetal Bpp Wo Non Stress  09/20/2014   OBSTETRICAL ULTRASOUND: This exam was performed  within a [place] Ultrasound Department. The OB US report was generated in the AS system, and faxed to the ordering physician.   This report is available in the [facility]. See the AS Obstetric US  report via the Image Link.  [place] Mfm Ob Follow Up  09/20/2014   OBSTETRICAL ULTRASOUND: This exam was performed within a [place] Ultrasound Department. The OB US report was generated in the AS system, and faxed to the ordering physician.   This report is available in the [facility]. See the AS Obstetric US report via the Image Link.   Assessment: Mazy Culton is  24 y.o. G2P0010 at 77w0dpresents with threatened preterm labor.  Plan: - cervix has thinned but has not dilated. Here she has received procardia 10 mg po x3 and her second dose of betamethasone. Her contractions continue q 4-5 minutes but are milder in severity. Her labs reveal stable mild hyponatremia but ketones in urine and low bicarb. She does not have symptoms DKA and her glucose is 160s-180s. Will give 1 L fluids and re-check BMP. Will check GBS status. Will evaluate for infection with gc/chlamydia and GBS.   18:54 [facility]46 PM

## 2014-09-21 NOTE — MAU Note (Signed)
Patient states that the contractions do not feel as painful as previous.

## 2014-09-21 NOTE — Telephone Encounter (Signed)
Called charge nurse in MAU and asked her to have on call provider review patients blood sugar log. She stated that she would take care of it.

## 2014-09-21 NOTE — MAU Note (Signed)
Called Dr. Ashok Pall spoke with Denny Peon RN in L&D informed he needs to come to MAU room 5 to evaluate the patient when available.

## 2014-09-21 NOTE — H&P (Signed)
Faculty Practice Antenatal History and Physical  Makayla Meyer PXT:[id number] DOB: 01-28-1990 DOA: 09/21/2014  Chief Complaint: contractions  HPI: Makayla Meyer is a 24 y.o. 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  At 23 weeks her cervix measured 2.2 cm. On f/u 8/31 her cervix measured 1.8 and so she was sent to our MAU for evaluation and betamethazone. Cervix was closed at that check and she received BMZ. She returns today for her second dose.  She started having contractions at 2 PM today.  Her contractions are described as moderate in intensity and increasing in intensity and frequency.  Now her contractions are every 4-6 minutes. They briefly improved with procardia, but increased in intensity again.  No provoking factors.  No leakage of fluid or contractions. No headache. No fever or chills or dysuria. Positive fetal movement.  Review of Systems:   Pt denies any fevers, chills, nausea, vomiting, diarrhea, constipation, vaginal bleeding, headache, leaking fluid, headache, blurred vision, shortness of breath, chest pain.  Review of systems are otherwise negative  Prenatal History/Complications: Cervical insuffiency, Class B DM.  Past Medical History: Past Medical History  Diagnosis Date  . Diabetes mellitus without complication   . UTI (lower urinary tract infection)     Past Surgical History: Past Surgical History  Procedure Laterality Date  . No past surgeries      Obstetrical History: OB History    Gravida Para Term Preterm AB TAB SAB Ectopic Multiple Living   _0 Social History: Social History   Social History  . Marital Status: Single    Spouse Name: N/A  . Number of Children: N/A  . Years of Education: N/A   Social History Main Topics  . Smoking status: Never Smoker   . Smokeless tobacco: Never Used  . Alcohol Use: Yes   Comment: before pregnancy  . Drug Use: Yes     Comment: occasionally before pregnancy  . Sexual Activity: Yes    Birth Control/ Protection: None   Other Topics Concern  . None   Social History Narrative    Family History: Family History  Problem Relation Age of Onset  . Hypertension Mother   . Asthma Mother   . Diabetes Mother   . Depression Mother   . Diabetes Sister     Allergies: No Known Allergies  Prescriptions prior to admission  Medication Sig Dispense Refill Last Dose  . aspirin 81 MG chewable tablet Chew 1 tablet (81 mg total) by mouth daily. 90 tablet 3 09/21/2014 at Unknown time  . glyBURIDE (DIABETA) 2.5 MG tablet Take 1 tablet (2.5 mg total) by mouth 2 (two) times daily with a meal. 60 tablet 3 09/21/2014 at Unknown time  . metFORMIN (GLUCOPHAGE) 500 MG tablet Take 1 tablet (500 mg total) by mouth 2 (two) times daily with a meal. 60 tablet 0 09/21/2014 at Unknown time  . prenatal vitamin w/FE, FA (PRENATAL 1 + 1) 27-1 MG TABS tablet Take 1 tablet by mouth daily at 12 noon. (Patient taking differently: Take 1 tablet by mouth daily. ) 30 each 10 09/20/2014 at Unknown time  . progesterone (PROMETRIUM) 200 MG capsule Place 200 mg vaginally at bedtime.    09/20/2014 at Unknown time  . ACCU-CHEK FASTCLIX LANCETS MISC 1 Units by Does not apply route 4 (four) times daily. 1[place]  each 3 Taking  . Blood Glucose Monitoring Suppl (ACCU-CHEK NANO SMARTVIEW) W/DEVICE KIT 1 Device by Does not apply route 4 (four) times daily. 1 kit 0 Taking  . glucose blood (ACCU-CHEK SMARTVIEW) test strip 1 strip four times daily 51 each 12 Taking    Physical Exam: BP 116/51 mmHg  Pulse 94  Temp(Src) 97.7 F (36.5 C) (Oral)  Resp 18  Ht _0  (1.549 m)  Wt 131 lb (59.421 kg)  BMI 24.76 kg/m2  LMP 03/09/2014 (Exact Date)  General appearance: alert, cooperative and no distress Head: Normocephalic, without obvious abnormality, atraumatic Eyes: Conjunctiva clear, EMOI, anicteric Neck: no  adenopathy, no JVD, supple, symmetrical, trachea midline and thyroid not enlarged, symmetric, no tenderness/mass/nodules Lungs: clear to auscultation bilaterally and normal percussion bilaterally Heart: regular rate and rhythm, S1, S2 normal, no murmur, click, rub or gallop Abdomen: soft, non-tender; bowel sounds normal; no masses,  no organomegaly and Fundal height at dates Extremities: extremities normal, atraumatic, no cyanosis or edema Pulses: 2+ and symmetric Skin: Skin color, texture, turgor normal. No rashes or lesions Neurologic: Alert and oriented X 3, normal strength and tone. Normal symmetric reflexes. Normal coordination and gait cephalic Baseline: 829 bpm, Variability: Good {> 6 bpm), Accelerations: Reactive and Decelerations: Absent Frequency: Every 2-3 minutes and Duration: 60 seconds Dilation: Closed Cervical Position: Anterior Exam by:: Noni Saupe NP            Labs on Admission:  Basic Metabolic Panel:  Recent Labs Lab 09/21/14 1609  NA 131*  K 4.4  CL 100*  CO2 16*  GLUCOSE 182*  BUN 13  CREATININE 0.55  CALCIUM 9.7   Liver Function Tests:  Recent Labs Lab 09/21/14 1609  AST 22  ALT 31  ALKPHOS 72  BILITOT 0.5  PROT 7.3  ALBUMIN 3.4*   No results for input(s): LIPASE, AMYLASE in the last 168 hours. No results for input(s): AMMONIA in the last 168 hours. CBC:  Recent Labs Lab 09/21/14 1609  WBC 15.6*  HGB 11.6*  HCT 33.7*  MCV 83.2  PLT 282    CBG:  Recent Labs Lab 09/20/14 1615 09/21/14 1612  GLUCAP 248* 166*    Radiological Exams on Admission: [place] Mfm Ob Transvaginal  09/20/2014   OBSTETRICAL ULTRASOUND: This exam was performed within a [place] Ultrasound Department. The OB US report was generated in the AS system, and faxed to the ordering physician.   This report is available in the [facility]. See the AS Obstetric US report via the Image Link.  [place] Mfm Fetal Bpp Wo Non Stress  09/20/2014   OBSTETRICAL ULTRASOUND:  This exam was performed within a [place] Ultrasound Department. The OB US report was generated in the AS system, and faxed to the ordering physician.   This report is available in the [facility]. See the AS Obstetric US report via the Image Link.  [place] Mfm Ob Follow Up  09/20/2014   OBSTETRICAL ULTRASOUND: This exam was performed within a [place] Ultrasound Department. The OB US report was generated in the AS system, and faxed to the ordering physician.   This report is available in the [facility]. See the AS Obstetric US report via the Image Link.    Assessment/Plan Present on Admission:  . Preterm labor . Hyponatremia . Diabetes mellitus affecting pregnancy, antepartum . Cervical shortening affecting pregnancy, antepartum . BV (bacterial vaginosis)  1.  Preterm Labor  Admit  Will start magnesium - 4g load with 2g/hr continuous  Patient received second dose of betamethasone  Continuous monitoring for now.  GC/CT, wet prep obtained - Wet prep positive for BV 2.  Hyponatremia - mild (likely hypovolemic  Continue IV fluids - LR overnight  Recheck metabolic panel in AM 3.  Diabetes Mellitus in pregnancy  Continue glyburide and metformin  CBG fasting and 2hr PP 4.  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  BV  Metronidazole   Code Status: Full  Family Communication: mother   Disposition Plan: Admission   Truett Mainland, DO 09/21/2014 9:00 PM Faculty Practice Attending Physician [facility] Attending Phone #: [phone]

## 2014-09-21 NOTE — MAU Note (Signed)
Called Dr. Ashok Pall in OR unavailable to see patient asked MAU providers to evaluate patient.

## 2014-09-21 NOTE — MAU Note (Signed)
Pt was here yesterday for contractions. Given  Betamethasone. Here for second injections but c/o increased pain with ctx. Stated they are coming closer together than they were yesterday

## 2014-09-21 NOTE — MAU Note (Signed)
Diet ordered for patient (pot roast with gravy, smashed potatoes, tossed garden salad with [place] dressing)

## 2014-09-21 NOTE — Telephone Encounter (Signed)
Dr. Penne Lash requested that we call patient to check her blood sugar readings today or tomorrow as she was just in MAU for a bmz and found to have elevated readings. She would like for the doctor in clinic to adjust meds based on readings. I called patient and her mother stated that patient is at work and shes not sure she can get in touch with her. She stated that she will try to get in touch with her and have her call us. She did tell me that patient is coming to MAU today at 5:00 for her second dose.

## 2014-09-21 NOTE — MAU Provider Note (Signed)
History     CSN: [id number]  Arrival date and time: 09/21/14 1510   First Provider Initiated Contact with Patient 09/21/14 1546      Chief Complaint  Patient presents with  . Contractions   HPI   Ms.Makayla Meyer is a 24 y.o. female G2P0010 @ 68w0dpresenting to MAU with contractions; she was sent over yesterday from MFM for cervical exam & monitoring. Pt is being followed by MFM for short cervix; was 2.2 cm at 23 wks, is 1.8 cm yesterday. Patient received her first dose of betamethasone yesterday at 1700 and is scheduled to have her second dose this evening. She came early today because she is concerned about the contraction pain she is having. The contractions are stronger and closer together.   Denies LOF or vaginal bleeding.  Positive fetal movement.   OB History    Gravida Para Term Preterm AB TAB SAB Ectopic Multiple Living   _0 Past Medical History  Diagnosis Date  . Diabetes mellitus without complication   . UTI (lower urinary tract infection)     Past Surgical History  Procedure Laterality Date  . No past surgeries      Family History  Problem Relation Age of Onset  . Hypertension Mother   . Asthma Mother   . Diabetes Mother   . Depression Mother   . Diabetes Sister     Social History  Substance Use Topics  . Smoking status: Never Smoker   . Smokeless tobacco: Never Used  . Alcohol Use: Yes     Comment: before pregnancy    Allergies: No Known Allergies  Prescriptions prior to admission  Medication Sig Dispense Refill Last Dose  . ACCU-CHEK FASTCLIX LANCETS MISC 1 Units by Does not apply route 4 (four) times daily. 102 each 3 Taking  . aspirin 81 MG chewable tablet Chew 1 tablet (81 mg total) by mouth daily. 90 tablet 3 09/20/2014 at 1630  . Blood Glucose Monitoring Suppl (ACCU-CHEK NANO SMARTVIEW) W/DEVICE KIT 1 Device by Does not apply route 4 (four) times daily. 1 kit 0 Taking  . glucose blood (ACCU-CHEK SMARTVIEW) test strip 1 strip  four times daily 51 each 12 Taking  . glyBURIDE (DIABETA) 2.5 MG tablet Take 1 tablet (2.5 mg total) by mouth 2 (two) times daily with a meal. 60 tablet 3 09/20/2014 at 1630  . metFORMIN (GLUCOPHAGE) 500 MG tablet Take 1 tablet (500 mg total) by mouth 2 (two) times daily with a meal. 60 tablet 0   . prenatal vitamin w/FE, FA (PRENATAL 1 + 1) 27-1 MG TABS tablet Take 1 tablet by mouth daily at 12 noon. (Patient taking differently: Take 1 tablet by mouth daily. ) 30 each 10 09/19/2014 at Unknown time  . progesterone (PROMETRIUM) 200 MG capsule Place 200 mg vaginally daily.   09/19/2014 at Unknown time   Results for orders placed or performed during the hospital encounter of 09/20/14 (from the past 48 hour(s))  [facility], [facility]     Status: Abnormal   Collection Time: 09/20/14  3:55 PM  Result Value Ref Range   Color, Urine YELLOW YELLOW   APPearance CLEAR CLEAR   Specific Gravity, Urine 1.015 1.005 - 1.030   pH 6.0 5.0 - 8.0   Glucose, UA >1000 (A) NEGATIVE mg/dL   Hgb urine dipstick TRACE (A) NEGATIVE   Bilirubin Urine NEGATIVE NEGATIVE  Ketones, ur 15 (A) NEGATIVE mg/dL   Protein, ur NEGATIVE NEGATIVE mg/dL   Urobilinogen, UA 0.2 0.0 - 1.0 mg/dL   Nitrite NEGATIVE NEGATIVE   Leukocytes, UA NEGATIVE NEGATIVE  Urine microscopic-add on     Status: Abnormal   Collection Time: 09/20/14  3:55 PM  Result Value Ref Range   Squamous Epithelial / LPF FEW (A) RARE   WBC, UA 0-2 <3 WBC/hpf  Glucose, capillary     Status: Abnormal   Collection Time: 09/20/14  4:15 PM  Result Value Ref Range   Glucose-Capillary 248 (H) 65 - 99 mg/dL   Comment 1 Document in Chart     Review of Systems  Constitutional: Negative for fever and chills.  Gastrointestinal: Positive for abdominal pain. Negative for nausea and vomiting.  Genitourinary: Negative for dysuria.   Physical Exam   Blood pressure 116/51, pulse 94, temperature 97.7 F (36.5 C), temperature source Oral,  resp. rate 18, last menstrual period 03/09/2014.  Physical Exam  Constitutional: She is oriented to person, place, and time. She appears well-developed and well-nourished. No distress.  Musculoskeletal: Normal range of motion.  Neurological: She is alert and oriented to person, place, and time.  Skin: Skin is warm. She is not diaphoretic.    Fetal Tracing: Baseline: 145 bpm Variability: moderate  Accelerations: 15x15 Decelerations: quick variables Toco: Q2-4 mins; irregular pattern   Dilation: Closed Cervical Position: Anterior Exam by:: Noni Saupe NP  MAU Course  Procedures  None  MDM  Procardia 59m every 10 mins X 4 doses  PO hydration  Betamethasone second dose @ 1700  CBC CMP   Per Dr. LRoseanne Kaufmannote: Patient has shortened cervix and received steroids. Patient will need to increase her medications in the short term. Report given to Dr. WSi Raiderwho resumes care of the patient.   JLezlie Lye NP   Assessment and Plan

## 2014-09-21 NOTE — Telephone Encounter (Deleted)
Dr. Penne Lash requested that we call patient to check her blood sugar readings as she was just in MAU for a bmz and found to have elevated readings. She would like for the doctor in clinic to adjust meds based on readings. I called patient and her mother stated that patient is at work and shes not sure she can get in touch with her. She stated that she will try to get in touch with her and have her call us. She did tell me that patient is coming to MAU today at 5:00 for her second dose. I will call MAU if patient doesn't call back and ask them to have a provider review her readings while she is there.

## 2014-09-21 NOTE — MAU Provider Note (Signed)
MAU HISTORY AND PHYSICAL  Chief Complaint:  Contractions   Makayla Meyer is a 24 y.o.  G2P0010 with IUP at 70w0dpresenting for Contractions  Patient has hx poorly-controlled pregestational diabetes and cervical insufficiency. At 23 weeks her cervix measured 2.2 cm. On f/u 8/31 her cervix measured 1.8 and so she was sent to our MAU for evaluation and betamethazone. Cervix was closed at that check and she received BMZ. She returns today for her second dose and notes that beginning 2 PM today she has had moderate severeity contractions every 4 or so minutes. No leakage of fluid or contractions. No headache. No fever or chills or dysuria. Positive fetal movement.  Menstrual History: OB History    Gravida Para Term Preterm AB TAB SAB Ectopic Multiple Living   _0 Patient's last menstrual period was 03/09/2014 (exact date).      Past Medical History  Diagnosis Date  . Diabetes mellitus without complication   . UTI (lower urinary tract infection)     Past Surgical History  Procedure Laterality Date  . No past surgeries      Family History  Problem Relation Age of Onset  . Hypertension Mother   . Asthma Mother   . Diabetes Mother   . Depression Mother   . Diabetes Sister     Social History  Substance Use Topics  . Smoking status: Never Smoker   . Smokeless tobacco: Never Used  . Alcohol Use: Yes     Comment: before pregnancy     No Known Allergies  Prescriptions prior to admission  Medication Sig Dispense Refill Last Dose  . aspirin 81 MG chewable tablet Chew 1 tablet (81 mg total) by mouth daily. 90 tablet 3 09/21/2014 at Unknown time  . glyBURIDE (DIABETA) 2.5 MG tablet Take 1 tablet (2.5 mg total) by mouth 2 (two) times daily with a meal. 60 tablet 3 09/21/2014 at Unknown time  . metFORMIN (GLUCOPHAGE) 500 MG tablet Take 1 tablet (500 mg total) by mouth 2 (two) times daily with a meal. 60 tablet 0 09/21/2014 at Unknown time  . prenatal vitamin w/FE, FA (PRENATAL  1 + 1) 27-1 MG TABS tablet Take 1 tablet by mouth daily at 12 noon. (Patient taking differently: Take 1 tablet by mouth daily. ) 30 each 10 09/20/2014 at Unknown time  . progesterone (PROMETRIUM) 200 MG capsule Place 200 mg vaginally at bedtime.    09/20/2014 at Unknown time  . ACCU-CHEK FASTCLIX LANCETS MISC 1 Units by Does not apply route 4 (four) times daily. 102 each 3 Taking  . Blood Glucose Monitoring Suppl (ACCU-CHEK NANO SMARTVIEW) W/DEVICE KIT 1 Device by Does not apply route 4 (four) times daily. 1 kit 0 Taking  . glucose blood (ACCU-CHEK SMARTVIEW) test strip 1 strip four times daily 51 each 12 Taking    Review of Systems - Negative except for what is mentioned in HPI.  Physical Exam  Blood pressure 116/51, pulse 94, temperature 97.7 F (36.5 C), temperature source Oral, resp. rate 18, last menstrual period 03/09/2014. GENERAL: Well-developed, well-nourished female in no acute distress.  LUNGS: Clear to auscultation bilaterally.  HEART: Regular rate and rhythm. ABDOMEN: Soft, nontender, nondistended, gravid.  EXTREMITIES: Nontender, no edema, 2+ distal pulses. Cervical Exam: not dilated per NP exam FHT:  150/mod/+a/-d Contractions: q 4-5 min   Labs: Results for orders placed or performed during the hospital encounter of 09/21/14 (from the past  24 hour(s))  Urinalysis, Routine w reflex microscopic (not at [facility])   Collection Time: 09/21/14  3:25 PM  Result Value Ref Range   Color, Urine YELLOW YELLOW   APPearance HAZY (A) CLEAR   Specific Gravity, Urine >1.030 (H) 1.005 - 1.030   pH 5.5 5.0 - 8.0   Glucose, UA 500 (A) NEGATIVE mg/dL   Hgb urine dipstick TRACE (A) NEGATIVE   Bilirubin Urine NEGATIVE NEGATIVE   Ketones, ur >80 (A) NEGATIVE mg/dL   Protein, ur NEGATIVE NEGATIVE mg/dL   Urobilinogen, UA 0.2 0.0 - 1.0 mg/dL   Nitrite NEGATIVE NEGATIVE   Leukocytes, UA NEGATIVE NEGATIVE  Urine microscopic-add on   Collection Time: 09/21/14  3:25 PM  Result Value Ref Range    Squamous Epithelial / LPF FEW (A) RARE   WBC, UA 0-2 <3 WBC/hpf  CBC   Collection Time: 09/21/14  4:09 PM  Result Value Ref Range   WBC 15.6 (H) 4.0 - 10.5 K/uL   RBC 4.05 3.87 - 5.11 MIL/uL   Hemoglobin 11.6 (L) 12.0 - 15.0 g/dL   HCT 33.7 (L) 36.0 - 46.0 %   MCV 83.2 78.0 - 100.0 fL   MCH 28.6 26.0 - 34.0 pg   MCHC 34.4 30.0 - 36.0 g/dL   RDW 13.6 11.5 - 15.5 %   Platelets 282 150 - 400 K/uL  Comprehensive metabolic panel   Collection Time: 09/21/14  4:09 PM  Result Value Ref Range   Sodium 131 (L) 135 - 145 mmol/L   Potassium 4.4 3.5 - 5.1 mmol/L   Chloride 100 (L) 101 - 111 mmol/L   CO2 16 (L) 22 - 32 mmol/L   Glucose, Bld 182 (H) 65 - 99 mg/dL   BUN 13 6 - 20 mg/dL   Creatinine, Ser 0.55 0.44 - 1.00 mg/dL   Calcium 9.7 8.9 - 10.3 mg/dL   Total Protein 7.3 6.5 - 8.1 g/dL   Albumin 3.4 (L) 3.5 - 5.0 g/dL   AST 22 15 - 41 U/L   ALT 31 14 - 54 U/L   Alkaline Phosphatase 72 38 - 126 U/L   Total Bilirubin 0.5 0.3 - 1.2 mg/dL   GFR calc non Af Amer >60 >60 mL/min   GFR calc Af Amer >60 >60 mL/min   Anion gap 15 5 - 15  Glucose, capillary   Collection Time: 09/21/14  4:12 PM  Result Value Ref Range   Glucose-Capillary 166 (H) 65 - 99 mg/dL  Wet prep, genital   Collection Time: 09/21/14  6:20 PM  Result Value Ref Range   Yeast Wet Prep HPF POC NONE SEEN NONE SEEN   Trich, Wet Prep NONE SEEN NONE SEEN   Clue Cells Wet Prep HPF POC FEW (A) NONE SEEN   WBC, Wet Prep HPF POC MODERATE (A) NONE SEEN    Imaging Studies:  [place] Mfm Ob Transvaginal  09/20/2014   OBSTETRICAL ULTRASOUND: This exam was performed within a [place] [facility] Department. The OB US report was generated in the AS system, and faxed to the ordering physician.   This report is available in the [facility]. See the AS Obstetric US report via the Image Link.  [place] Mfm Ob Transvaginal  09/06/2014   OBSTETRICAL ULTRASOUND: This exam was performed within a [place] [facility] Department. The OB US report was  generated in the AS system, and faxed to the ordering physician.   This report is available in the [facility]. See the AS Obstetric US  report via the Image Link.  [place] Mfm Fetal Bpp Wo Non Stress  09/20/2014   OBSTETRICAL ULTRASOUND: This exam was performed within a [place] [facility] Department. The OB US report was generated in the AS system, and faxed to the ordering physician.   This report is available in the [facility]. See the AS Obstetric US report via the Image Link.  [place] Mfm Ob Follow Up  09/20/2014   OBSTETRICAL ULTRASOUND: This exam was performed within a [place] [facility] Department. The OB US report was generated in the AS system, and faxed to the ordering physician.   This report is available in the [facility]. See the AS Obstetric US report via the Image Link.   Assessment: Makayla Meyer is  24 y.o. G2P0010 at [redacted]w[redacted]d presents with threatened preterm labor.  Plan: - cervix has thinned but has not dilated. Here she has received procardia 10 mg po x3 and her second dose of betamethasone. Her contractions continue q 4-5 minutes but are milder in severity. Her labs reveal stable mild hyponatremia but ketones in urine and low bicarb. She does not have symptoms DKA and her glucose is 160s-180s. Will give 1 L fluids and re-check BMP. Will check GBS status. Will evaluate for infection with gc/chlamydia and GBS.   18:54 - care transferred to [facility], CNM   CRESENZO-DISHMAN,Jeptha Hinnenkamp 9/1/20168:37 PM   Contractions slowed down for a short time, now are q 4-5 minutes and stronger per pt.  Cx still closed/short.  FHR reactive.  Discussed with Dr. Nehemiah Settle. Will admit for observation for MgSO4. Continue prometrium, monitor BS/labs.

## 2014-09-22 DIAGNOSIS — E1165 Type 2 diabetes mellitus with hyperglycemia: Secondary | ICD-10-CM

## 2014-09-22 DIAGNOSIS — O24113 Pre-existing diabetes mellitus, type 2, in pregnancy, third trimester: Secondary | ICD-10-CM

## 2014-09-22 DIAGNOSIS — B9689 Other specified bacterial agents as the cause of diseases classified elsewhere: Secondary | ICD-10-CM

## 2014-09-22 DIAGNOSIS — E871 Hypo-osmolality and hyponatremia: Secondary | ICD-10-CM

## 2014-09-22 DIAGNOSIS — O26873 Cervical shortening, third trimester: Secondary | ICD-10-CM

## 2014-09-22 DIAGNOSIS — N760 Acute vaginitis: Secondary | ICD-10-CM

## 2014-09-22 DIAGNOSIS — O99820 Streptococcus B carrier state complicating pregnancy: Secondary | ICD-10-CM

## 2014-09-22 DIAGNOSIS — Z3A28 28 weeks gestation of pregnancy: Secondary | ICD-10-CM

## 2014-09-22 DIAGNOSIS — O23593 Infection of other part of genital tract in pregnancy, third trimester: Secondary | ICD-10-CM

## 2014-09-22 LAB — CULTURE, OB URINE: Culture: NO GROWTH

## 2014-09-22 LAB — COMPREHENSIVE METABOLIC PANEL
ALK PHOS: 77 U/L (ref 38–126)
ALT: 31 U/L (ref 14–54)
AST: 21 U/L (ref 15–41)
Albumin: 3.3 g/dL — ABNORMAL LOW (ref 3.5–5.0)
Anion gap: 16 — ABNORMAL HIGH (ref 5–15)
BUN: 13 mg/dL (ref 6–20)
CALCIUM: 8.1 mg/dL — AB (ref 8.9–10.3)
CO2: 11 mmol/L — AB (ref 22–32)
CREATININE: 0.69 mg/dL (ref 0.44–1.00)
Chloride: 105 mmol/L (ref 101–111)
GFR calc non Af Amer: >60 mL/min (ref >60)
Glucose, Bld: 232 mg/dL — ABNORMAL HIGH (ref 65–99)
Potassium: 4.5 mmol/L (ref 3.5–5.1)
SODIUM: 132 mmol/L — AB (ref 135–145)
Total Bilirubin: 0.8 mg/dL (ref 0.3–1.2)
Total Protein: 6.9 g/dL (ref 6.5–8.1)

## 2014-09-22 LAB — GLUCOSE, CAPILLARY
GLUCOSE-CAPILLARY: 179 mg/dL — AB (ref 65–99)
Glucose-Capillary: 151 mg/dL — ABNORMAL HIGH (ref 65–99)
Glucose-Capillary: 257 mg/dL — ABNORMAL HIGH (ref 65–99)

## 2014-09-22 LAB — CULTURE, BETA STREP (GROUP B ONLY): SPECIAL REQUESTS: NORMAL

## 2014-09-22 LAB — GC/CHLAMYDIA PROBE AMP (~~LOC~~) NOT AT ARMC
CHLAMYDIA, DNA PROBE: NEGATIVE
Neisseria Gonorrhea: NEGATIVE

## 2014-09-22 LAB — HIV ANTIBODY (ROUTINE TESTING W REFLEX): HIV Screen 4th Generation wRfx: NONREACTIVE

## 2014-09-22 MED ORDER — INSULIN ASPART 100 UNIT/ML ~~LOC~~ SOLN
0.0000 [IU] | Freq: Three times a day (TID) | SUBCUTANEOUS | Status: DC
  Administered 2014-09-22 – 2014-09-24 (×4): 3 [IU] via SUBCUTANEOUS

## 2014-09-22 MED ORDER — INSULIN ASPART 100 UNIT/ML ~~LOC~~ SOLN
3.0000 [IU] | Freq: Three times a day (TID) | SUBCUTANEOUS | Status: DC
  Administered 2014-09-22 – 2014-09-24 (×7): 3 [IU] via SUBCUTANEOUS

## 2014-09-22 MED ORDER — ALUM & MAG HYDROXIDE-SIMETH 200-200-20 MG/5ML PO SUSP
30.0000 mL | ORAL | Status: DC | PRN
  Administered 2014-09-22: 30 mL via ORAL
  Filled 2014-09-22: qty 30

## 2014-09-22 MED ORDER — PANTOPRAZOLE SODIUM 40 MG PO TBEC: 40.0000 mg | DELAYED_RELEASE_TABLET | Freq: Every day | ORAL | Status: DC

## 2014-09-22 MED ORDER — PANTOPRAZOLE SODIUM 40 MG PO TBEC
40.0000 mg | DELAYED_RELEASE_TABLET | Freq: Every day | ORAL | Status: DC
  Administered 2014-09-22 – 2014-09-24 (×4): 40 mg via ORAL
  Filled 2014-09-22 (×4): qty 1

## 2014-09-22 MED ORDER — INSULIN ASPART 100 UNIT/ML ~~LOC~~ SOLN: 3.0000 [IU] | Freq: Once | SUBCUTANEOUS | Status: AC

## 2014-09-22 NOTE — Progress Notes (Signed)
FACULTY PRACTICE ANTEPARTUM(COMPREHENSIVE) NOTE  Tijana Ticer is a 24 y.o. G2P0010 at [redacted]w[redacted]d  who is admitted for short cervix, preterm contractions, who has completed BMZ and now 24 hrs s/p second dose BMZ, was kept on Mag x 2 hrs.   Fetal presentation is cephalic. Length of Stay:    Days Fetal Monitoring:  Baseline: 145 bpm, Variability: Fair (1-6 bpm) and Accelerations: Non-reactive but appropriate for gestational age   Medications:  Scheduled . aspirin EC  81 mg Oral Daily  . docusate sodium  100 mg Oral Daily  . glyBURIDE  2.5 mg Oral BID WC  . insulin aspart  0-15 Units Subcutaneous TID WC  . insulin aspart  3 Units Subcutaneous TID WC  . metFORMIN  500 mg Oral BID WC  . metroNIDAZOLE  500 mg Oral Q12H  . pantoprazole  40 mg Oral Daily  . prenatal multivitamin  1 tablet Oral Q1200  . progesterone  200 mg Vaginal QHS   I have reviewed the patient's current medications.  ASSESSMENT: Patient Active Problem List   Diagnosis Date Noted  . Preterm labor 09/21/2014  . Hyponatremia 09/21/2014  . BV (bacterial vaginosis) 09/21/2014  . Cervical shortening affecting pregnancy, antepartum   . Maternal pregestational diabetes classes B through R, antepartum   . Diabetes mellitus affecting pregnancy, antepartum 05/29/2014  . Supervision of high risk pregnancy, antepartum 05/29/2014    PLAN: D/c mag sulfate at this time Consider Procardia if PTL returns. Probable outpt care in a.m if stable  Rayvin Abid V 09/22/2014,10:56 PM

## 2014-09-22 NOTE — Progress Notes (Signed)
Inpatient Diabetes Program Recommendations  AACE/ADA: New Consensus Statement on Inpatient Glycemic Control (2013)  Target Ranges:  Prepandial:   less than 140 mg/dL      Peak postprandial:   less than 180 mg/dL (1-2 hours)      Critically ill patients:  140 - 180 mg/dL   Results for ABIGAL, CHOUNG (MRN [id number]) as of 09/22/2014 07:10  Ref. Range 09/20/2014 16:15 09/21/2014 16:12 09/22/2014 00:48  Glucose-Capillary Latest Ref Range: 65-99 mg/dL 409 (H) 811 (H) 914 (H)    Diabetes history: DM2 ([redacted]W[redacted]D) Outpatient Diabetes medications: Glyburide 2.5 mg BID, Metformin 500 mg BID Current orders for Inpatient glycemic control: Glyburide 2.5 mg BID, Metformin 500 mg BID  Inpatient Diabetes Program Recommendations Correction (SSI): Patient admitted with preterm labor and has received Betamethasone which is contributing to hyperglycemia. Please consider ordering Novolog 0-16 units QID (fasting and 2 hour post prandial glucose) per Diabetic Pregnant Patient order set.  Thanks, Orlando Penner, RN, MSN, CCRN, CDE Diabetes Coordinator Inpatient Diabetes Program [phone] (Team Pager from 8am to 5pm) [phone] ([facility] office) [phone] [facility] office) [phone] [facility] office)

## 2014-09-22 NOTE — Progress Notes (Signed)
Orders given to increase Magnesium to 3g/hr due to Uterine activity.

## 2014-09-22 NOTE — Progress Notes (Signed)
Patient ID: Makayla Meyer, female   DOB: [birth year], 24 y.o.   MRN: [id number] FACULTY PRACTICE ANTEPARTUM(COMPREHENSIVE) NOTE  Makayla Meyer is a 24 y.o. G2P0010 at [redacted]w[redacted]d by LMP who is admitted for Preterm labor.   Fetal presentation is cephalic. Length of Stay:    Days  Subjective: Patient reports the fetal movement as active. Patient reports uterine contraction  activity as irregular, every 5-8 minutes. Patient reports  vaginal bleeding as none. Patient describes fluid per vagina as None.  Vitals:  Blood pressure 121/71, pulse 108, temperature 97.8 F (36.6 C), temperature source Oral, resp. rate 16, height 5\' 1"  (1.549 m), weight 59.421 kg (131 lb), last menstrual period 03/09/2014, SpO2 99 %. Physical Examination:  General appearance - alert, well appearing, and in no distress Heart - normal rate and regular rhythm Abdomen - soft, nontender, nondistended Fundal Height:  size equals dates Cervical Exam: Not evaluated.  Extremities: extremities normal, atraumatic, no cyanosis or edema  Membranes:intact  Fetal Monitoring:   Fetal Heart Rate A     Mode  External filed at 09/22/2014 0904    Baseline Rate (A)  150 bpm filed at 09/22/2014 4098    Variability  <5 BPM filed at 09/22/2014 1191    Accelerations  None filed at 09/22/2014 4782    Decelerations  Variable filed at 09/22/2014 9562        Labs:  Results for orders placed or performed during the hospital encounter of 09/21/14 (from the past 24 hour(s))  Urinalysis, Routine w reflex microscopic (not at [facility])   Collection Time: 09/21/14  3:25 PM  Result Value Ref Range   Color, Urine YELLOW YELLOW   APPearance HAZY (A) CLEAR   Specific Gravity, Urine >1.030 (H) 1.005 - 1.030   pH 5.5 5.0 - 8.0   Glucose, UA 500 (A) NEGATIVE mg/dL   Hgb urine dipstick TRACE (A) NEGATIVE   Bilirubin Urine NEGATIVE NEGATIVE   Ketones, ur >80 (A) NEGATIVE mg/dL   Protein, ur NEGATIVE NEGATIVE mg/dL   Urobilinogen, UA 0.2 0.0 - 1.0  mg/dL   Nitrite NEGATIVE NEGATIVE   Leukocytes, UA NEGATIVE NEGATIVE  Urine microscopic-add on   Collection Time: 09/21/14  3:25 PM  Result Value Ref Range   Squamous Epithelial / LPF FEW (A) RARE   WBC, UA 0-2 <3 WBC/hpf  OB Urine Culture   Collection Time: 09/21/14  3:55 PM  Result Value Ref Range   Specimen Description OB CLEAN CATCH    Special Requests NONE    Culture      NO GROWTH < 12 HOURS Performed at [facility]    Report Status PENDING   CBC   Collection Time: 09/21/14  4:09 PM  Result Value Ref Range   WBC 15.6 (H) 4.0 - 10.5 K/uL   RBC 4.05 3.87 - 5.11 MIL/uL   Hemoglobin 11.6 (L) 12.0 - 15.0 g/dL   HCT 13.0 (L) 86.5 - 78.4 %   MCV 83.2 78.0 - 100.0 fL   MCH 28.6 26.0 - 34.0 pg   MCHC 34.4 30.0 - 36.0 g/dL   RDW 69.6 29.5 - 28.4 %   Platelets 282 150 - 400 K/uL  Comprehensive metabolic panel   Collection Time: 09/21/14  4:09 PM  Result Value Ref Range   Sodium 131 (L) 135 - 145 mmol/L   Potassium 4.4 3.5 - 5.1 mmol/L   Chloride 100 (L) 101 - 111 mmol/L   CO2 16 (L) 22 - 32 mmol/L   Glucose, Bld  182 (H) 65 - 99 mg/dL   BUN 13 6 - 20 mg/dL   Creatinine, Ser 4.09 0.44 - 1.00 mg/dL   Calcium 9.7 8.9 - 81.1 mg/dL   Total Protein 7.3 6.5 - 8.1 g/dL   Albumin 3.4 (L) 3.5 - 5.0 g/dL   AST 22 15 - 41 U/L   ALT 31 14 - 54 U/L   Alkaline Phosphatase 72 38 - 126 U/L   Total Bilirubin 0.5 0.3 - 1.2 mg/dL   GFR calc non Af Amer >60 >60 mL/min   GFR calc Af Amer >60 >60 mL/min   Anion gap 15 5 - 15  HIV antibody   Collection Time: 09/21/14  4:09 PM  Result Value Ref Range   HIV Screen 4th Generation wRfx Non Reactive Non Reactive  Type and screen   Collection Time: 09/21/14  4:09 PM  Result Value Ref Range   ABO/RH(D) A POS    Antibody Screen NEG    Sample Expiration 09/24/2014   ABO/Rh   Collection Time: 09/21/14  4:09 PM  Result Value Ref Range   ABO/RH(D) A POS   Glucose, capillary   Collection Time: 09/21/14  4:12 PM  Result Value Ref Range    Glucose-Capillary 166 (H) 65 - 99 mg/dL  Wet prep, genital   Collection Time: 09/21/14  6:20 PM  Result Value Ref Range   Yeast Wet Prep HPF POC NONE SEEN NONE SEEN   Trich, Wet Prep NONE SEEN NONE SEEN   Clue Cells Wet Prep HPF POC FEW (A) NONE SEEN   WBC, Wet Prep HPF POC MODERATE (A) NONE SEEN  Glucose, capillary   Collection Time: 09/22/14 12:48 AM  Result Value Ref Range   Glucose-Capillary 257 (H) 65 - 99 mg/dL  Comprehensive metabolic panel   Collection Time: 09/22/14  5:15 AM  Result Value Ref Range   Sodium 132 (L) 135 - 145 mmol/L   Potassium 4.5 3.5 - 5.1 mmol/L   Chloride 105 101 - 111 mmol/L   CO2 11 (L) 22 - 32 mmol/L   Glucose, Bld 232 (H) 65 - 99 mg/dL   BUN 13 6 - 20 mg/dL   Creatinine, Ser 9.14 0.44 - 1.00 mg/dL   Calcium 8.1 (L) 8.9 - 10.3 mg/dL   Total Protein 6.9 6.5 - 8.1 g/dL   Albumin 3.3 (L) 3.5 - 5.0 g/dL   AST 21 15 - 41 U/L   ALT 31 14 - 54 U/L   Alkaline Phosphatase 77 38 - 126 U/L   Total Bilirubin 0.8 0.3 - 1.2 mg/dL   GFR calc non Af Amer >60 >60 mL/min   GFR calc Af Amer >60 >60 mL/min   Anion gap 16 (H) 5 - 15  Glucose, capillary   Collection Time: 09/22/14  6:59 AM  Result Value Ref Range   Glucose-Capillary 179 (H) 65 - 99 mg/dL    Imaging Studies:     Currently EPIC will not allow sonographic studies to automatically populate into notes.  In the meantime, copy and paste results into note or free text.  Medications:  Scheduled . aspirin EC  81 mg Oral Daily  . docusate sodium  100 mg Oral Daily  . glyBURIDE  2.5 mg Oral BID WC  . insulin aspart  0-15 Units Subcutaneous TID WC  . insulin aspart  3 Units Subcutaneous TID WC  . metFORMIN  500 mg Oral BID WC  . metroNIDAZOLE  500 mg Oral Q12H  . pantoprazole  40 mg Oral Daily  . prenatal multivitamin  1 tablet Oral Q1200  . progesterone  200 mg Vaginal QHS   I have reviewed the patient's current medications.  ASSESSMENT: Patient Active Problem List   Diagnosis Date Noted  .  Preterm labor 09/21/2014  . Hyponatremia 09/21/2014  . BV (bacterial vaginosis) 09/21/2014  . Cervical shortening affecting pregnancy, antepartum   . Maternal pregestational diabetes classes B through R, antepartum   . Diabetes mellitus affecting pregnancy, antepartum 05/29/2014  . Supervision of high risk pregnancy, antepartum 05/29/2014    PLAN: Magnesium sulfate 3 g/hr, follow s/sx PTL Cover hyperglycemia with insulin as ordered  ARNOLD,JAMES 09/22/2014,9:39 AM

## 2014-09-22 NOTE — Progress Notes (Signed)
Initial Nutrition Assessment  DOCUMENTATION CODES:   Not applicable  INTERVENTION:  CHO modified gestational diabetic diet  NUTRITION DIAGNOSIS:   Increased nutrient needs related to  (pregnancy and fetal growth requirements) as evidenced by  ([redacted] weeks gestation).   GOAL:   Patient will meet greater than or equal to 90% of their needs  MONITOR:   Weight trends  REASON FOR ASSESSMENT:   Gestational Diabetes, Antenatal    ASSESSMENT: Pt asleep. 28 1/7 weeks adm with PTL. Betamethasone. Family member reports appetite has been good PTA. 12 lb weight gain since initial prenatal visit. Pre-preg BMI 22.5, goal weight gain 25-30 lbs  Diet Order:  Diet gestational carb mod Room service appropriate?: Yes; Fluid consistency:: Thin  Height:   Ht Readings from Last 1 Encounters:  09/21/14  (1.549 m)    Weight:   Wt Readings from Last 1 Encounters:  09/21/14 131 lb (59.421 kg)    Ideal Body Weight:    105 lbs  BMI:  Body mass index is 24.76 kg/(m^2).  Estimated Nutritional Needs:   Kcal:  1600-1800  Protein:  68-78 g  Fluid:  2 L  EDUCATION NEEDS:   No education needs identified at this time (Diet educ completed in [facility] on 06/08/14)  Inez Pilgrim.Odis Luster LDN Neonatal Nutrition Support Specialist/RD III Pager [phone]      Phone [phone]

## 2014-09-23 DIAGNOSIS — E1165 Type 2 diabetes mellitus with hyperglycemia: Secondary | ICD-10-CM | POA: Diagnosis present

## 2014-09-23 DIAGNOSIS — Z8249 Family history of ischemic heart disease and other diseases of the circulatory system: Secondary | ICD-10-CM | POA: Diagnosis not present

## 2014-09-23 DIAGNOSIS — E871 Hypo-osmolality and hyponatremia: Secondary | ICD-10-CM | POA: Diagnosis present

## 2014-09-23 DIAGNOSIS — N760 Acute vaginitis: Secondary | ICD-10-CM | POA: Diagnosis present

## 2014-09-23 DIAGNOSIS — B9689 Other specified bacterial agents as the cause of diseases classified elsewhere: Secondary | ICD-10-CM | POA: Diagnosis present

## 2014-09-23 DIAGNOSIS — O26872 Cervical shortening, second trimester: Secondary | ICD-10-CM | POA: Diagnosis not present

## 2014-09-23 DIAGNOSIS — O24919 Unspecified diabetes mellitus in pregnancy, unspecified trimester: Secondary | ICD-10-CM | POA: Diagnosis not present

## 2014-09-23 DIAGNOSIS — O99820 Streptococcus B carrier state complicating pregnancy: Secondary | ICD-10-CM | POA: Diagnosis present

## 2014-09-23 DIAGNOSIS — Z3A28 28 weeks gestation of pregnancy: Secondary | ICD-10-CM | POA: Diagnosis present

## 2014-09-23 DIAGNOSIS — O23593 Infection of other part of genital tract in pregnancy, third trimester: Secondary | ICD-10-CM | POA: Diagnosis present

## 2014-09-23 DIAGNOSIS — O26873 Cervical shortening, third trimester: Secondary | ICD-10-CM | POA: Diagnosis present

## 2014-09-23 DIAGNOSIS — O24113 Pre-existing diabetes mellitus, type 2, in pregnancy, third trimester: Secondary | ICD-10-CM | POA: Diagnosis present

## 2014-09-23 DIAGNOSIS — Z833 Family history of diabetes mellitus: Secondary | ICD-10-CM | POA: Diagnosis not present

## 2014-09-23 LAB — GLUCOSE, CAPILLARY
GLUCOSE-CAPILLARY: 124 mg/dL — AB (ref 65–99)
GLUCOSE-CAPILLARY: 124 mg/dL — AB (ref 65–99)
Glucose-Capillary: 157 mg/dL — ABNORMAL HIGH (ref 65–99)
Glucose-Capillary: 131 mg/dL — ABNORMAL HIGH (ref 65–99)

## 2014-09-23 MED ORDER — NIFEDIPINE 10 MG PO CAPS
10.0000 mg | ORAL_CAPSULE | Freq: Once | ORAL | Status: AC
  Administered 2014-09-23: 10 mg via ORAL
  Filled 2014-09-23: qty 1

## 2014-09-23 MED ORDER — NIFEDIPINE ER OSMOTIC RELEASE 30 MG PO TB24
30.0000 mg | ORAL_TABLET | Freq: Two times a day (BID) | ORAL | Status: DC
  Administered 2014-09-23 – 2014-09-24 (×3): 30 mg via ORAL
  Filled 2014-09-23 (×3): qty 1

## 2014-09-23 NOTE — Progress Notes (Signed)
Patient ID: Makayla Meyer, female   DOB: 1990-11-28, 24 y.o.   MRN: [id number] FACULTY PRACTICE ANTEPARTUM(COMPREHENSIVE) NOTE  Makayla Meyer is a 24 y.o. G2P0010 at [redacted]w[redacted]d by best clinical estimate who is admitted for short cervix, pTL.   Fetal presentation is cephalic. Length of Stay:    Days  Subjective: Pt completed 24 hr Mag sulfate at 3 g/hr at 10 pm, mag was d/c'd. The pt then rested well til this a.m. Without d/c or contractions. Monitoring of this slim pt this am shows mild brief contractions every 6-12 min. Will tx with procardia and observe today Patient reports the fetal movement as active. Patient reports uterine contraction  activity as none, none felt by pt. Patient reports  vaginal bleeding as none. Patient describes fluid per vagina as None.  Vitals:  Blood pressure 99/57, pulse 101, temperature 98.5 F (36.9 C), temperature source Oral, resp. rate 18, height  (1.549 m), weight 131 lb (59.421 kg), last menstrual period 03/09/2014, SpO2 99 %. Physical Examination:  General appearance - alert, well appearing, and in no distress, oriented to person, place, and time and normal appearing weight Heart - normal rate and regular rhythm Abdomen - soft, nontender, nondistended Fundal Height:  size equals dates Cervical Exam: Not evaluated Extremities: extremities normal, atraumatic, no cyanosis or edema and Homans sign is negative, no sign of DVT with DTRs 2+ bilaterally Membranes:intact  Fetal Monitoring:  Baseline: 145 bpm and Variability: Good {> 6 bpm)  Labs:  Results for orders placed or performed during the hospital encounter of 09/21/14 (from the past 24 hour(s))  Glucose, capillary   Collection Time: 09/22/14 12:16 PM  Result Value Ref Range   Glucose-Capillary 151 (H) 65 - 99 mg/dL  Glucose, capillary   Collection Time: 09/23/14  1:09 AM  Result Value Ref Range   Glucose-Capillary 157 (H) 65 - 99 mg/dL  Glucose, capillary   Collection Time: 09/23/14  6:19 AM  Result  Value Ref Range   Glucose-Capillary 131 (H) 65 - 99 mg/dL    Imaging Studies:     Currently EPIC will not allow sonographic studies to automatically populate into notes.  In the meantime, copy and paste results into note or free text.  Medications:  Scheduled . aspirin EC  81 mg Oral Daily  . docusate sodium  100 mg Oral Daily  . glyBURIDE  2.5 mg Oral BID WC  . insulin aspart  0-15 Units Subcutaneous TID WC  . insulin aspart  3 Units Subcutaneous TID WC  . metFORMIN  500 mg Oral BID WC  . metroNIDAZOLE  500 mg Oral Q12H  . NIFEdipine  30 mg Oral BID  . pantoprazole  40 mg Oral Daily  . prenatal multivitamin  1 tablet Oral Q1200  . progesterone  200 mg Vaginal QHS   I have reviewed the patient's current medications.  ASSESSMENT: Patient Active Problem List   Diagnosis Date Noted  . Preterm labor 09/21/2014  . Hyponatremia 09/21/2014  . BV (bacterial vaginosis) 09/21/2014  . Cervical shortening affecting pregnancy, antepartum   . Maternal pregestational diabetes classes B through R, antepartum   . Diabetes mellitus affecting pregnancy, antepartum 05/29/2014  . Supervision of high risk pregnancy, antepartum 05/29/2014    PLAN: Procardia today, if stable , consider d/c tomorrow.  Makayla Meyer V 09/23/2014,8:52 AM

## 2014-09-24 DIAGNOSIS — O26872 Cervical shortening, second trimester: Secondary | ICD-10-CM

## 2014-09-24 DIAGNOSIS — O99820 Streptococcus B carrier state complicating pregnancy: Secondary | ICD-10-CM

## 2014-09-24 LAB — GLUCOSE, CAPILLARY
GLUCOSE-CAPILLARY: 186 mg/dL — AB (ref 65–99)
Glucose-Capillary: 126 mg/dL — ABNORMAL HIGH (ref 65–99)
Glucose-Capillary: 197 mg/dL — ABNORMAL HIGH (ref 65–99)

## 2014-09-24 MED ORDER — METRONIDAZOLE 500 MG PO TABS: 500.0000 mg | ORAL_TABLET | Freq: Two times a day (BID) | ORAL | Status: DC

## 2014-09-24 MED ORDER — NIFEDIPINE ER 30 MG PO TB24: 30.0000 mg | ORAL_TABLET | Freq: Two times a day (BID) | ORAL | Status: DC

## 2014-09-24 NOTE — Discharge Instructions (Signed)

## 2014-09-24 NOTE — Progress Notes (Signed)
Discharged home per wheelchair with family at side. Discharge teaching completed.

## 2014-09-24 NOTE — Progress Notes (Signed)
Pt asks re: need for SSI when she goes home. I explained to pt that CBG's should improve as she is further away from Betamethasone shots.  Review of records show cbg's in 180-190 range PC in evenings, with fastings still elevated in a.m I have recommended that pt may check her evening PC and if these elevated values continue, above 140, she may add an additional Metformin 500 to her evening dosing. Pt advised to report any dose additions at next appt and place in CBG log

## 2014-09-24 NOTE — Discharge Summary (Addendum)
Antenatal Physician Discharge Summary  Patient ID: Makayla Meyer MRN: [id number] DOB/AGE: 14-Nov-1990 24 y.o.  Admit date: 09/21/2014 Discharge date: 09/24/2014  Admission Diagnoses:  Patient Active Problem List   Diagnosis Date Noted  . Group B Streptococcus carrier, +RV culture, currently pregnant 09/24/2014  . Preterm labor 09/21/2014  . Cervical shortening affecting pregnancy, antepartum   . Diabetes mellitus affecting pregnancy, antepartum 05/29/2014  . Supervision of high risk pregnancy, antepartum 05/29/2014   Discharge Diagnoses:  The same  Prenatal Procedures: NST  Consults: None  Hospital Course:  This is a 24 y.o. G2P0010 with IUP at 24w3dadmitted for concern about preterm labor. As per H&P by Dr. SNehemiah Settle  "Makayla Harpis a 24y.o. 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 At 23 weeks her cervix measured 2.2 cm. On f/u 8/31 her cervix measured 1.8 and so she was sent to our MAU for evaluation and betamethazone. Cervix was closed at that check and she received BMZ. She returns today for her second dose. She started having contractions at 2 PM today. Her contractions are described as moderate in intensity and increasing in intensity and frequency. Now her contractions are every 4-6 minutes".  On admission exam she had a closed cervix but was admitted for close observation.  No leaking of fluid and no bleeding.  She was initially started on magnesium sulfate for tocolysis and neuroprotection and also received betamethasone x 2 doses.  Her tocolysis was transitioned to Procardia.  She was observed, fetal heart rate monitoring remained reassuring, and she had no signs/symptoms of progressing preterm labor or other maternal-fetal concerns. Her blood sugars were initially elevated after betamethasone, but stabilized. She will continue oral hypoglycemics as prescribed.   Her cervical exam was unchanged from admission.  She was deemed stable for discharge to home with outpatient follow up.  Discharge Exam: BP 99/62 mmHg  Pulse 104  Temp(Src) 98.5 F (36.9 C) (Oral)  Resp 18  Ht 5' 1"  (1.549 m)  Wt 131 lb (59.421 kg)  BMI 24.76 kg/m2  SpO2 99%  LMP 03/09/2014 (Exact Date) Baseline: 150 bpm, Variability: Moderate {> 6 bpm), Accelerations: Reactive and Decelerations: Absent Contraction Frequency: None General appearance: alert, cooperative and no distress Lungs: clear to auscultation bilaterally and normal percussion bilaterally Heart: regular rate and rhythm, S1, S2 normal, no murmur, click, rub or gallop Abdomen: soft, non-tender; bowel sounds normal; no masses, no organomegaly and Fundal height at dates Cervix: Closed/50%/high Extremities: extremities normal, atraumatic, no cyanosis or edema Pulses: 2+ and symmetric Skin: Skin color, texture, turgor normal. No rashes or lesions Neurologic: Alert and oriented X 3, normal strength and tone. Normal symmetric reflexes. Normal coordination and gait cephalic  Significant Diagnostic Studies:  Results for orders placed or performed during the hospital encounter of 09/21/14 (from the past 168 hour(s))  GC/Chlamydia probe amp ([place])[facility] Collection Time: 09/21/14 12:00 AM  Result Value Ref Range   Chlamydia Negative    Neisseria gonorrhea Negative   Urinalysis, [facility] w reflex microscopic [facility]  Collection Time: 09/21/14  3:25 PM  Result Value Ref Range   Color, Urine YELLOW YELLOW   APPearance HAZY (A) CLEAR   Specific Gravity, Urine >1.030 (H) 1.005 - 1.030   pH 5.5 5.0 - 8.0   Glucose, UA 500 (A) NEGATIVE mg/dL   Hgb urine dipstick TRACE (A) NEGATIVE  Bilirubin Urine NEGATIVE NEGATIVE   Ketones, ur >80 (A) NEGATIVE mg/dL   Protein, ur NEGATIVE NEGATIVE mg/dL   Urobilinogen, UA 0.2 0.0 - 1.0 mg/dL   Nitrite NEGATIVE NEGATIVE   Leukocytes, UA NEGATIVE NEGATIVE  Urine  microscopic-add on   Collection Time: 09/21/14  3:25 PM  Result Value Ref Range   Squamous Epithelial / LPF FEW (A) RARE   WBC, UA 0-2 <3 WBC/hpf  OB Urine Culture   Collection Time: 09/21/14  3:55 PM  Result Value Ref Range   Specimen Description OB CLEAN CATCH    Special Requests NONE    Culture      NO GROWTH < 24 HOURS Performed at [facility]    Report Status 09/22/2014 FINAL   CBC   Collection Time: 09/21/14  4:09 PM  Result Value Ref Range   WBC 15.6 (H) 4.0 - 10.5 K/uL   RBC 4.05 3.87 - 5.11 MIL/uL   Hemoglobin 11.6 (L) 12.0 - 15.0 g/dL   HCT 33.7 (L) 36.0 - 46.0 %   MCV 83.2 78.0 - 100.0 fL   MCH 28.6 26.0 - 34.0 pg   MCHC 34.4 30.0 - 36.0 g/dL   RDW 13.6 11.5 - 15.5 %   Platelets 282 150 - 400 K/uL  Comprehensive metabolic panel   Collection Time: 09/21/14  4:09 PM  Result Value Ref Range   Sodium 131 (L) 135 - 145 mmol/L   Potassium 4.4 3.5 - 5.1 mmol/L   Chloride 100 (L) 101 - 111 mmol/L   CO2 16 (L) 22 - 32 mmol/L   Glucose, Bld 182 (H) 65 - 99 mg/dL   BUN 13 6 - 20 mg/dL   Creatinine, Ser 0.55 0.44 - 1.00 mg/dL   Calcium 9.7 8.9 - 10.3 mg/dL   Total Protein 7.3 6.5 - 8.1 g/dL   Albumin 3.4 (L) 3.5 - 5.0 g/dL   AST 22 15 - 41 U/L   ALT 31 14 - 54 U/L   Alkaline Phosphatase 72 38 - 126 U/L   Total Bilirubin 0.5 0.3 - 1.2 mg/dL   GFR calc non Af Amer >60 >60 mL/min   GFR calc Af Amer >60 >60 mL/min   Anion gap 15 5 - 15  HIV antibody   Collection Time: 09/21/14  4:09 PM  Result Value Ref Range   HIV Screen 4th Generation wRfx Non Reactive Non Reactive  Type and screen   Collection Time: 09/21/14  4:09 PM  Result Value Ref Range   ABO/RH(D) A POS    Antibody Screen NEG    Sample Expiration 09/24/2014   ABO/Rh   Collection Time: 09/21/14  4:09 PM  Result Value Ref Range   ABO/RH(D) A POS   Glucose, capillary   Collection Time: 09/21/14  4:12 PM  Result Value Ref Range   Glucose-Capillary 166 (H) 65 - 99 mg/dL  Culture, beta strep  (group b only)   Collection Time: 09/21/14  5:45 PM  Result Value Ref Range   Specimen Description VAGINAL/RECTAL    Special Requests Normal    Culture      GROUP B STREP(S.AGALACTIAE)ISOLATED Performed at [facility]    Report Status 09/22/2014 FINAL   Wet prep, genital   Collection Time: 09/21/14  6:20 PM  Result Value Ref Range   Yeast Wet Prep HPF POC NONE SEEN NONE SEEN   Trich, Wet Prep NONE SEEN NONE SEEN   Clue Cells Wet Prep HPF POC FEW (A) NONE SEEN  WBC, Wet Prep HPF POC MODERATE (A) NONE SEEN  Glucose, capillary   Collection Time: 09/22/14 12:48 AM  Result Value Ref Range   Glucose-Capillary 257 (H) 65 - 99 mg/dL  Comprehensive metabolic panel   Collection Time: 09/22/14  5:15 AM  Result Value Ref Range   Sodium 132 (L) 135 - 145 mmol/L   Potassium 4.5 3.5 - 5.1 mmol/L   Chloride 105 101 - 111 mmol/L   CO2 11 (L) 22 - 32 mmol/L   Glucose, Bld 232 (H) 65 - 99 mg/dL   BUN 13 6 - 20 mg/dL   Creatinine, Ser 0.69 0.44 - 1.00 mg/dL   Calcium 8.1 (L) 8.9 - 10.3 mg/dL   Total Protein 6.9 6.5 - 8.1 g/dL   Albumin 3.3 (L) 3.5 - 5.0 g/dL   AST 21 15 - 41 U/L   ALT 31 14 - 54 U/L   Alkaline Phosphatase 77 38 - 126 U/L   Total Bilirubin 0.8 0.3 - 1.2 mg/dL   GFR calc non Af Amer >60 >60 mL/min   GFR calc Af Amer >60 >60 mL/min   Anion gap 16 (H) 5 - 15  Glucose, capillary   Collection Time: 09/22/14  6:59 AM  Result Value Ref Range   Glucose-Capillary 179 (H) 65 - 99 mg/dL  Glucose, capillary   Collection Time: 09/22/14 12:16 PM  Result Value Ref Range   Glucose-Capillary 151 (H) 65 - 99 mg/dL  Glucose, capillary   Collection Time: 09/22/14  6:44 PM  Result Value Ref Range   Glucose-Capillary 186 (H) 65 - 99 mg/dL  Glucose, capillary   Collection Time: 09/23/14  1:09 AM  Result Value Ref Range   Glucose-Capillary 157 (H) 65 - 99 mg/dL  Glucose, capillary   Collection Time: 09/23/14  6:19 AM  Result Value Ref Range   Glucose-Capillary 131 (H) 65 -  99 mg/dL  Glucose, capillary   Collection Time: 09/23/14  9:58 AM  Result Value Ref Range   Glucose-Capillary 124 (H) 65 - 99 mg/dL  Glucose, capillary   Collection Time: 09/23/14  6:12 PM  Result Value Ref Range   Glucose-Capillary 124 (H) 65 - 99 mg/dL  Glucose, capillary   Collection Time: 09/24/14 12:38 AM  Result Value Ref Range   Glucose-Capillary 197 (H) 65 - 99 mg/dL  Glucose, capillary   Collection Time: 09/24/14  6:20 AM  Result Value Ref Range   Glucose-Capillary 126 (H) 65 - 99 mg/dL  Results for orders placed or performed during the hospital encounter of 09/20/14 (from the past 168 hour(s))  Urinalysis, Routine w reflex microscopic (not at [facility])   Collection Time: 09/20/14  3:55 PM  Result Value Ref Range   Color, Urine YELLOW YELLOW   APPearance CLEAR CLEAR   Specific Gravity, Urine 1.015 1.005 - 1.030   pH 6.0 5.0 - 8.0   Glucose, UA >1000 (A) NEGATIVE mg/dL   Hgb urine dipstick TRACE (A) NEGATIVE   Bilirubin Urine NEGATIVE NEGATIVE   Ketones, ur 15 (A) NEGATIVE mg/dL   Protein, ur NEGATIVE NEGATIVE mg/dL   Urobilinogen, UA 0.2 0.0 - 1.0 mg/dL   Nitrite NEGATIVE NEGATIVE   Leukocytes, UA NEGATIVE NEGATIVE  Urine microscopic-add on   Collection Time: 09/20/14  3:55 PM  Result Value Ref Range   Squamous Epithelial / LPF FEW (A) RARE   WBC, UA 0-2 <3 WBC/hpf  Glucose, capillary   Collection Time: 09/20/14  4:15 PM  Result Value Ref Range   Glucose-Capillary  248 (H) 65 - 99 mg/dL   Comment 1 Document in Chart     Discharge Condition: Stable  Disposition: 01-Home or Self Care     Medication List    TAKE these medications        ACCU-CHEK FASTCLIX LANCETS Misc  1 Units by Does not apply route 4 (four) times daily.     ACCU-CHEK NANO SMARTVIEW W/DEVICE Kit  1 Device by Does not apply route 4 (four) times daily.     aspirin 81 MG chewable tablet  Chew 1 tablet (81 mg total) by mouth daily.     glucose blood test strip  Commonly known as:   ACCU-CHEK SMARTVIEW  1 strip four times daily     glyBURIDE 2.5 MG tablet  Commonly known as:  DIABETA  Take 1 tablet (2.5 mg total) by mouth 2 (two) times daily with a meal.     metFORMIN 500 MG tablet  Commonly known as:  GLUCOPHAGE  Take 1 tablet (500 mg total) by mouth 2 (two) times daily with a meal.     metroNIDAZOLE 500 MG tablet  Commonly known as:  FLAGYL  Take 1 tablet (500 mg total) by mouth 2 (two) times daily.     NIFEdipine 30 MG 24 hr tablet  Commonly known as:  PROCARDIA-XL/ADALAT CC  Take 1 tablet (30 mg total) by mouth 2 (two) times daily.     prenatal vitamin w/FE, FA 27-1 MG Tabs tablet  Take 1 tablet by mouth daily at 12 noon.     progesterone 200 MG capsule  Commonly known as:  PROMETRIUM  Place 200 mg vaginally at bedtime.           Follow-up Information    Follow up with [place] On 10/02/2014.   Why:  9:00 am for prenatal appointment. Call clinic/come to MAU for any concerning issues.   Contact information:   [place] [phone]      Signed: Verita Schneiders A M.D. 09/24/2014, 7:57 AM

## 2014-10-02 ENCOUNTER — Encounter: Payer: Self-pay | Admitting: Obstetrics and Gynecology

## 2014-10-02 ENCOUNTER — Ambulatory Visit (INDEPENDENT_AMBULATORY_CARE_PROVIDER_SITE_OTHER): Payer: Medicaid Other | Admitting: Obstetrics and Gynecology

## 2014-10-02 VITALS — BP 110/72 | HR 93 | Temp 97.7°F | Wt 125.1 lb

## 2014-10-02 DIAGNOSIS — O99820 Streptococcus B carrier state complicating pregnancy: Secondary | ICD-10-CM

## 2014-10-02 DIAGNOSIS — O24919 Unspecified diabetes mellitus in pregnancy, unspecified trimester: Secondary | ICD-10-CM

## 2014-10-02 DIAGNOSIS — O26873 Cervical shortening, third trimester: Secondary | ICD-10-CM

## 2014-10-02 DIAGNOSIS — O0993 Supervision of high risk pregnancy, unspecified, third trimester: Secondary | ICD-10-CM

## 2014-10-02 DIAGNOSIS — Z22330 Carrier of Group B streptococcus: Secondary | ICD-10-CM

## 2014-10-02 LAB — POCT URINALYSIS DIP (DEVICE)
BILIRUBIN URINE: NEGATIVE
GLUCOSE, UA: 500 mg/dL — AB
NITRITE: NEGATIVE
PH: 6 (ref 5.0–8.0)
Protein, ur: 30 mg/dL — AB
Specific Gravity, Urine: >=1.03 (ref 1.005–1.030)
Urobilinogen, UA: 0.2 mg/dL (ref 0.0–1.0)

## 2014-10-02 MED ORDER — GLYBURIDE 5 MG PO TABS: 5.0000 mg | ORAL_TABLET | Freq: Two times a day (BID) | ORAL | Status: DC

## 2014-10-02 MED ORDER — FLUCONAZOLE 150 MG PO TABS: 150.0000 mg | ORAL_TABLET | Freq: Once | ORAL | Status: DC

## 2014-10-02 NOTE — Progress Notes (Signed)
Subjective:  Makayla Meyer is a 24 y.o. G2P0010 at [redacted]w[redacted]d being seen today for ongoing prenatal care.  Patient reports occasional contractions improved on Procardia.  Contractions: Irritability.  Vag. Bleeding: None. Movement: Present. Denies leaking of fluid.   The following portions of the patient's history were reviewed and updated as appropriate: allergies, current medications, past family history, past medical history, past social history, past surgical history and problem list.   Objective:   Filed Vitals:   10/02/14 0918  BP: 110/72  Pulse: 93  Temp: 97.7 F (36.5 C)  Weight: 125 lb 1.6 oz (56.745 kg)    Fetal Status: Fetal Heart Rate (bpm): 142   Movement: Present     General:  Alert, oriented and cooperative. Patient is in no acute distress.  Skin: Skin is warm and dry. No rash noted.   Cardiovascular: Normal heart rate noted  Respiratory: Normal respiratory effort, no problems with respiration noted  Abdomen: Soft, gravid, appropriate for gestational age. Pain/Pressure: Absent     Pelvic: Vag. Bleeding: None Vag D/C Character: White   Cervical exam deferred        Extremities: Normal range of motion.  Edema: None  Mental Status: Normal mood and affect. Normal behavior. Normal judgment and thought content.   Urinalysis:      Assessment and Plan:  Pregnancy: G2P0010 at [redacted]w[redacted]d  1. Supervision of high risk pregnancy, antepartum, third trimester Patient reports feeling well Will start twice weekly fetal testing at 32 weeks  2. Group B Streptococcus carrier, +RV culture, currently pregnant Will treat in labor  3. Diabetes mellitus affecting pregnancy, antepartum CBGs all out of range with fasting values as low 150 and as high as 193. Postprandial values ranging from 130-200's. Will increase glyburide to 5 mg BID Continue metformin  4. Cervical shortening affecting pregnancy, antepartum, third trimester Continue vaginal prometrium  Preterm labor symptoms and general  obstetric precautions including but not limited to vaginal bleeding, contractions, leaking of fluid and fetal movement were reviewed in detail with the patient. Please refer to After Visit Summary for other counseling recommendations.  Return in about 2 weeks (around 10/16/2014).   Catalina Antigua, MD

## 2014-10-02 NOTE — Progress Notes (Signed)
Declines flu vaccine.

## 2014-10-10 ENCOUNTER — Inpatient Hospital Stay (HOSPITAL_COMMUNITY)
Admission: AD | Admit: 2014-10-10 | Discharge: 2014-10-10 | Disposition: A | Discharge status: Home / Self Care | Payer: Medicaid Other | Source: Ambulatory Visit | Attending: Obstetrics & Gynecology | Admitting: Obstetrics & Gynecology

## 2014-10-10 DIAGNOSIS — Z3A30 30 weeks gestation of pregnancy: Secondary | ICD-10-CM | POA: Diagnosis not present

## 2014-10-10 DIAGNOSIS — R0602 Shortness of breath: Secondary | ICD-10-CM | POA: Diagnosis present

## 2014-10-10 DIAGNOSIS — O26893 Other specified pregnancy related conditions, third trimester: Secondary | ICD-10-CM

## 2014-10-10 DIAGNOSIS — R0600 Dyspnea, unspecified: Secondary | ICD-10-CM | POA: Diagnosis not present

## 2014-10-10 DIAGNOSIS — Z7982 Long term (current) use of aspirin: Secondary | ICD-10-CM | POA: Insufficient documentation

## 2014-10-10 LAB — COMPREHENSIVE METABOLIC PANEL
ALT: 15 U/L (ref 14–54)
AST: 16 U/L (ref 15–41)
Albumin: 3 g/dL — ABNORMAL LOW (ref 3.5–5.0)
Alkaline Phosphatase: 91 U/L (ref 38–126)
Anion gap: 9 (ref 5–15)
BILIRUBIN TOTAL: 0.3 mg/dL (ref 0.3–1.2)
BUN: 13 mg/dL (ref 6–20)
CO2: 17 mmol/L — ABNORMAL LOW (ref 22–32)
CREATININE: 0.5 mg/dL (ref 0.44–1.00)
Calcium: 9.2 mg/dL (ref 8.9–10.3)
Chloride: 106 mmol/L (ref 101–111)
Glucose, Bld: 185 mg/dL — ABNORMAL HIGH (ref 65–99)
Potassium: 4.1 mmol/L (ref 3.5–5.1)
Sodium: 132 mmol/L — ABNORMAL LOW (ref 135–145)
TOTAL PROTEIN: 6.6 g/dL (ref 6.5–8.1)

## 2014-10-10 LAB — URINALYSIS, ROUTINE W REFLEX MICROSCOPIC
BILIRUBIN URINE: NEGATIVE
Leukocytes, UA: NEGATIVE
Nitrite: NEGATIVE
PH: 5.5 (ref 5.0–8.0)
PROTEIN: NEGATIVE mg/dL
Specific Gravity, Urine: >1.03 — ABNORMAL HIGH (ref 1.005–1.030)
Urobilinogen, UA: 0.2 mg/dL (ref 0.0–1.0)

## 2014-10-10 LAB — URINE MICROSCOPIC-ADD ON

## 2014-10-10 LAB — CBC
HEMATOCRIT: 32.9 % — AB (ref 36.0–46.0)
Hemoglobin: 11.2 g/dL — ABNORMAL LOW (ref 12.0–15.0)
MCH: 27.8 pg (ref 26.0–34.0)
MCHC: 34 g/dL (ref 30.0–36.0)
MCV: 81.6 fL (ref 78.0–100.0)
PLATELETS: 241 10*3/uL (ref 150–400)
RBC: 4.03 MIL/uL (ref 3.87–5.11)
RDW: 13.7 % (ref 11.5–15.5)
WBC: 9.8 10*3/uL (ref 4.0–10.5)

## 2014-10-10 LAB — TSH: TSH: 0.578 u[IU]/mL (ref 0.350–4.500)

## 2014-10-10 MED ORDER — NIFEDIPINE 10 MG PO CAPS
10.0000 mg | ORAL_CAPSULE | Freq: Once | ORAL | Status: DC
  Filled 2014-10-10: qty 1

## 2014-10-10 MED ORDER — LACTATED RINGERS IV BOLUS (SEPSIS)
1000.0000 mL | Freq: Once | INTRAVENOUS | Status: AC
  Administered 2014-10-10: 1000 mL via INTRAVENOUS

## 2014-10-10 MED ORDER — LACTATED RINGERS IV SOLN
INTRAVENOUS | Status: DC
  Administered 2014-10-10: 19:00:00 via INTRAVENOUS

## 2014-10-10 NOTE — MAU Note (Signed)
Urine in lab 

## 2014-10-10 NOTE — MAU Note (Signed)
Pt presents to the MAU with shortness of breath that started this morning at 0345. States that when she experiences shortness of breath, her heart starts to beat faster, and then her stomach gets tight.

## 2014-10-10 NOTE — Discharge Instructions (Signed)
Shortness of Breath Shortness of breath means you have trouble breathing. It could also mean that you have a medical problem. You should get immediate medical care for shortness of breath. CAUSES   Not enough oxygen in the air such as with high altitudes or a smoke-filled room.  Certain lung diseases, infections, or problems.  Heart disease or conditions, such as angina or heart failure.  Low red blood cells (anemia).  Poor physical fitness, which can cause shortness of breath when you exercise.  Chest or back injuries or stiffness.  Being overweight.  Smoking.  Anxiety, which can make you feel like you are not getting enough air. DIAGNOSIS  Serious medical problems can often be found during your physical exam. Tests may also be done to determine why you are having shortness of breath. Tests may include:  Chest X-rays.  Lung function tests.  Blood tests.  An electrocardiogram (ECG).  An ambulatory electrocardiogram. An ambulatory ECG records your heartbeat patterns over a 24-hour period.  Exercise testing.  A transthoracic echocardiogram (TTE). During echocardiography, sound waves are used to evaluate how blood flows through your heart.  A transesophageal echocardiogram (TEE).  Imaging scans. Your health care provider may not be able to find a cause for your shortness of breath after your exam. In this case, it is important to have a follow-up exam with your health care provider as directed.  TREATMENT  Treatment for shortness of breath depends on the cause of your symptoms and can vary greatly. HOME CARE INSTRUCTIONS   Do not smoke. Smoking is a common cause of shortness of breath. If you smoke, ask for help to quit.  Avoid being around chemicals or things that may bother your breathing, such as paint fumes and dust.  Rest as needed. Slowly resume your usual activities.  If medicines were prescribed, take them as directed for the full length of time directed. This  includes oxygen and any inhaled medicines.  Keep all follow-up appointments as directed by your health care provider. SEEK MEDICAL CARE IF:   Your condition does not improve in the time expected.  You have a hard time doing your normal activities even with rest.  You have any new symptoms. SEEK IMMEDIATE MEDICAL CARE IF:   Your shortness of breath gets worse.  You feel light-headed, faint, or develop a cough not controlled with medicines.  You start coughing up blood.  You have pain with breathing.  You have chest pain or pain in your arms, shoulders, or abdomen.  You have a fever.  You are unable to walk up stairs or exercise the way you normally do. MAKE SURE YOU:  Understand these instructions.  Will watch your condition.  Will get help right away if you are not doing well or get worse. Document Released: 10/01/2000 Document Revised: 01/11/2013 Document Reviewed: 03/24/2011 [facility] Patient Information 2015 [place], [place]. This information is not intended to replace advice given to you by your health care provider. Make sure you discuss any questions you have with your health care provider. Preterm Labor Information Preterm labor is when labor starts at less than 37 weeks of pregnancy. The normal length of a pregnancy is 39 to 41 weeks. CAUSES Often, there is no identifiable underlying cause as to why a woman goes into preterm labor. One of the most common known causes of preterm labor is infection. Infections of the uterus, cervix, vagina, amniotic sac, bladder, kidney, or even the lungs (pneumonia) can cause labor to start. Other suspected  causes of preterm labor include:   Urogenital infections, such as yeast infections and bacterial vaginosis.   Uterine abnormalities (uterine shape, uterine septum, fibroids, or bleeding from the placenta).   A cervix that has been operated on (it may fail to stay closed).   Malformations in the fetus.   Multiple gestations  (twins, triplets, and so on).   Breakage of the amniotic sac.  RISK FACTORS  Having a previous history of preterm labor.   Having premature rupture of membranes (PROM).   Having a placenta that covers the opening of the cervix (placenta previa).   Having a placenta that separates from the uterus (placental abruption).   Having a cervix that is too weak to hold the fetus in the uterus (incompetent cervix).   Having too much fluid in the amniotic sac (polyhydramnios).   Taking illegal drugs or smoking while pregnant.   Not gaining enough weight while pregnant.   Being younger than 74 and older than 25 years old.   Having a low socioeconomic status.   Being African American. SYMPTOMS Signs and symptoms of preterm labor include:   Menstrual-like cramps, abdominal pain, or back pain.  Uterine contractions that are regular, as frequent as six in an hour, regardless of their intensity (may be mild or painful).  Contractions that start on the top of the uterus and spread down to the lower abdomen and back.   A sense of increased pelvic pressure.   A watery or bloody mucus discharge that comes from the vagina.  TREATMENT Depending on the length of the pregnancy and other circumstances, your health care provider may suggest bed rest. If necessary, there are medicines that can be given to stop contractions and to mature the fetal lungs. If labor happens before 34 weeks of pregnancy, a prolonged hospital stay may be recommended. Treatment depends on the condition of both you and the fetus.  WHAT SHOULD YOU DO IF YOU THINK YOU ARE IN PRETERM LABOR? Call your health care provider right away. You will need to go to the hospital to get checked immediately. HOW CAN YOU PREVENT PRETERM LABOR IN FUTURE PREGNANCIES? You should:   Stop smoking if you smoke.  Maintain healthy weight gain and avoid chemicals and drugs that are not necessary.  Be watchful for any type of  infection.  Inform your health care provider if you have a known history of preterm labor. Document Released: 03/29/2003 Document Revised: 09/08/2012 Document Reviewed: 02/09/2012 [facility] Patient Information 2015 [place], [place]. This information is not intended to replace advice given to you by your health care provider. Make sure you discuss any questions you have with your health care provider.

## 2014-10-10 NOTE — MAU Provider Note (Signed)
History     CSN: [id number]  Arrival date and time: 10/10/14 1708   None     Chief Complaint  Patient presents with  . Shortness of Breath  . Contractions   HPI This is a 24 y.o. female at 31w5dwho presents with c/o episodes of SOB and heart racing. Denies having these symptoms now. She reports that she first felt these sx yesterday around 2:30-3pm while she was at work. 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 This morning, she reports that she again felt tachycardic and SOB ("as if I had just run a race") around 3:45am. She reports that she sat up in bed and waited for her sx to resolve, which happened after 5-10 mins. Reports this happened again 6-7 times between 3:45-5am. She also reports a similar episode around 10-11am this morning. She clarifies that her SOB does not feel as if she is obstructed or wheezing, simply breathing very quickly. She also notes that after the tachycardia and SOB begin to resolve, she begins to feel a "tightening" in her upper abdomen down to her umbilicus that is painful/uncomfortable at a 7/10, but does not feel like the contractions she experienced during her admission on 09/24/14. She reports no history of airway disease, thyroid disease or anxiety. She does note, however, that she hyperventilated at work about 1 year ago and has had considerable stress both at home and school lately.   Note, patient has been taking her Procardia irregularly, as it causes her to feel "dizzy and loopy."    OB History    Gravida Para Term Preterm AB TAB SAB Ectopic Multiple Living   _0 Past Medical History  Diagnosis Date  . Diabetes mellitus without complication   . UTI (lower urinary tract infection)     Past Surgical History  Procedure Laterality Date  . No past surgeries      Family History  Problem Relation Age of  Onset  . Hypertension Mother   . Asthma Mother   . Diabetes Mother   . Depression Mother   . Diabetes Sister     Social History  Substance Use Topics  . Smoking status: Never Smoker   . Smokeless tobacco: Never Used  . Alcohol Use: Yes     Comment: before pregnancy    Allergies: No Known Allergies  Prescriptions prior to admission  Medication Sig Dispense Refill Last Dose  . ACCU-CHEK FASTCLIX LANCETS MISC 1 Units by Does not apply route 4 (four) times daily. 102 each 3 Taking  . aspirin 81 MG chewable tablet Chew 1 tablet (81 mg total) by mouth daily. 90 tablet 3 Taking  . Blood Glucose Monitoring Suppl (ACCU-CHEK NANO SMARTVIEW) W/DEVICE KIT 1 Device by Does not apply route 4 (four) times daily. 1 kit 0 Taking  . fluconazole (DIFLUCAN) 150 MG tablet Take 1 tablet (150 mg total) by mouth once. 1 tablet 0   . glucose blood (ACCU-CHEK SMARTVIEW) test strip 1 strip four times daily 51 each 12 Taking  . glyBURIDE (DIABETA) 5 MG tablet Take 1 tablet (5 mg total) by mouth 2 (two) times daily with a meal. 30 tablet 3   . metFORMIN (GLUCOPHAGE) 500 MG tablet Take 1 tablet (500 mg total) by mouth 2 (two) times daily with a meal. 60 tablet  0 Taking  . metroNIDAZOLE (FLAGYL) 500 MG tablet Take 1 tablet (500 mg total) by mouth 2 (two) times daily. 8 tablet 0 Taking  . NIFEdipine (PROCARDIA-XL/ADALAT CC) 30 MG 24 hr tablet Take 1 tablet (30 mg total) by mouth 2 (two) times daily. 60 tablet 4 Taking  . prenatal vitamin w/FE, FA (PRENATAL 1 + 1) 27-1 MG TABS tablet Take 1 tablet by mouth daily at 12 noon. (Patient taking differently: Take 1 tablet by mouth daily. ) 30 each 10 Taking  . progesterone (PROMETRIUM) 200 MG capsule Place 200 mg vaginally at bedtime.    Taking   Medical, Surgical, Family and Social histories reviewed and are listed above.  Medications and allergies reviewed.   Review of Systems  Constitutional: Positive for malaise/fatigue. Negative for fever, chills and weight loss.   HENT: Negative for congestion and sore throat.   Respiratory: Positive for shortness of breath. Negative for cough and wheezing.   Cardiovascular: Positive for palpitations. Negative for chest pain and leg swelling.  Gastrointestinal: Positive for abdominal pain and constipation. Negative for heartburn, nausea, vomiting and diarrhea.  Musculoskeletal: Negative for myalgias.  Neurological: Positive for dizziness and headaches. Negative for weakness.  Psychiatric/Behavioral: The patient is nervous/anxious.   Other systems negative  Physical Exam   Last menstrual period 03/09/2014.  Physical Exam  Constitutional: She appears well-developed and well-nourished. No distress.  HENT:  Head: Normocephalic and atraumatic.  Eyes: Conjunctivae are normal.  Neck: Normal range of motion. Neck supple.  Cardiovascular: Normal rate, regular rhythm and normal heart sounds.  Exam reveals no gallop and no friction rub.   No murmur heard. Respiratory: Effort normal and breath sounds normal. No respiratory distress. She has no wheezes. She has no rales. She exhibits no tenderness.  GI:  Gravid  Musculoskeletal: She exhibits no edema.  Skin: Skin is warm and dry. She is not diaphoretic.  Psychiatric: She has a normal mood and affect. Her behavior is normal. Thought content normal.    MAU Course  Procedures  MDM EKG Labs:  UA  TSH  CBC-- normal  CMP  EKG showed sinus tach with ? twave abnormality TSH drawn to rule out hyperthyroidism, Was normal earlier this year Results for orders placed or performed during the hospital encounter of 10/10/14 (from the past 24 hour(s))  Urinalysis, Routine w reflex microscopic (not at [facility])     Status: Abnormal   Collection Time: 10/10/14  5:15 PM  Result Value Ref Range   Color, Urine YELLOW YELLOW   APPearance CLEAR CLEAR   Specific Gravity, Urine >1.030 (H) 1.005 - 1.030   pH 5.5 5.0 - 8.0   Glucose, UA >1000 (A) NEGATIVE mg/dL   Hgb urine dipstick  TRACE (A) NEGATIVE   Bilirubin Urine NEGATIVE NEGATIVE   Ketones, ur >80 (A) NEGATIVE mg/dL   Protein, ur NEGATIVE NEGATIVE mg/dL   Urobilinogen, UA 0.2 0.0 - 1.0 mg/dL   Nitrite NEGATIVE NEGATIVE   Leukocytes, UA NEGATIVE NEGATIVE  Urine microscopic-add on     Status: None   Collection Time: 10/10/14  5:15 PM  Result Value Ref Range   Squamous Epithelial / LPF RARE RARE   WBC, UA 0-2 <3 WBC/hpf   RBC / HPF 0-2 <3 RBC/hpf   Bacteria, UA RARE RARE   Urine-Other MUCOUS PRESENT   CBC     Status: Abnormal   Collection Time: 10/10/14  6:12 PM  Result Value Ref Range   WBC 9.8 4.0 - 10.5 K/uL  RBC 4.03 3.87 - 5.11 MIL/uL   Hemoglobin 11.2 (L) 12.0 - 15.0 g/dL   HCT 32.9 (L) 36.0 - 46.0 %   MCV 81.6 78.0 - 100.0 fL   MCH 27.8 26.0 - 34.0 pg   MCHC 34.0 30.0 - 36.0 g/dL   RDW 13.7 11.5 - 15.5 %   Platelets 241 150 - 400 K/uL  Comprehensive metabolic panel     Status: Abnormal   Collection Time: 10/10/14  6:12 PM  Result Value Ref Range   Sodium 132 (L) 135 - 145 mmol/L   Potassium 4.1 3.5 - 5.1 mmol/L   Chloride 106 101 - 111 mmol/L   CO2 17 (L) 22 - 32 mmol/L   Glucose, Bld 185 (H) 65 - 99 mg/dL   BUN 13 6 - 20 mg/dL   Creatinine, Ser 0.50 0.44 - 1.00 mg/dL   Calcium 9.2 8.9 - 10.3 mg/dL   Total Protein 6.6 6.5 - 8.1 g/dL   Albumin 3.0 (L) 3.5 - 5.0 g/dL   AST 16 15 - 41 U/L   ALT 15 14 - 54 U/L   Alkaline Phosphatase 91 38 - 126 U/L   Total Bilirubin 0.3 0.3 - 1.2 mg/dL   GFR calc non Af Amer >60 >60 mL/min   GFR calc Af Amer >60 >60 mL/min   Anion gap 9 5 - 15   Consulted Dr Ihor Dow Will hydrate with IV fluids for 2 liters BS remains elevated with glycosuria. Suspect the chronic poor control of her blood sugar as well as poor PO hydration may be causing dehydration which may be leading to tachycardia.  High stress may also be a factor with possible panic attacks.Pt is feeling much better. SOB resolved and only feeling occasional contractions Assessment and Plan   Care turned over to oncoming Provider Shortness of Breath Preterm contractions Discharge to home  [facility] 10/10/2014, 6:09 PM

## 2014-10-16 ENCOUNTER — Encounter: Payer: Self-pay | Admitting: Obstetrics & Gynecology

## 2014-10-16 ENCOUNTER — Ambulatory Visit (INDEPENDENT_AMBULATORY_CARE_PROVIDER_SITE_OTHER): Payer: Medicaid Other | Admitting: Obstetrics & Gynecology

## 2014-10-16 ENCOUNTER — Encounter: Payer: Medicaid Other | Attending: Obstetrics & Gynecology | Admitting: *Deleted

## 2014-10-16 ENCOUNTER — Ambulatory Visit (HOSPITAL_COMMUNITY)
Admission: RE | Admit: 2014-10-16 | Discharge: 2014-10-16 | Disposition: A | Discharge status: Home / Self Care | Payer: Medicaid Other | Source: Ambulatory Visit | Attending: Obstetrics & Gynecology | Admitting: Obstetrics & Gynecology

## 2014-10-16 VITALS — BP 108/72 | HR 94 | Temp 97.7°F | Wt 131.3 lb

## 2014-10-16 DIAGNOSIS — O24919 Unspecified diabetes mellitus in pregnancy, unspecified trimester: Secondary | ICD-10-CM

## 2014-10-16 DIAGNOSIS — O0993 Supervision of high risk pregnancy, unspecified, third trimester: Secondary | ICD-10-CM | POA: Diagnosis present

## 2014-10-16 DIAGNOSIS — Z3A00 Weeks of gestation of pregnancy not specified: Secondary | ICD-10-CM | POA: Insufficient documentation

## 2014-10-16 DIAGNOSIS — O403XX0 Polyhydramnios, third trimester, not applicable or unspecified: Secondary | ICD-10-CM

## 2014-10-16 DIAGNOSIS — O26873 Cervical shortening, third trimester: Secondary | ICD-10-CM

## 2014-10-16 DIAGNOSIS — Z713 Dietary counseling and surveillance: Secondary | ICD-10-CM | POA: Insufficient documentation

## 2014-10-16 LAB — POCT URINALYSIS DIP (DEVICE)
BILIRUBIN URINE: NEGATIVE
Hgb urine dipstick: NEGATIVE
NITRITE: NEGATIVE
PH: 6 (ref 5.0–8.0)
Protein, ur: NEGATIVE mg/dL
Specific Gravity, Urine: 1.025 (ref 1.005–1.030)
Urobilinogen, UA: 0.2 mg/dL (ref 0.0–1.0)

## 2014-10-16 MED ORDER — INSULIN NPH (HUMAN) (ISOPHANE) 100 UNIT/ML ~~LOC~~ SUSP
SUBCUTANEOUS | Status: DC
Start: 2014-10-16 — End: 2014-10-29

## 2014-10-16 MED ORDER — INSULIN ASPART 100 UNIT/ML ~~LOC~~ SOLN
10.0000 [IU] | Freq: Three times a day (TID) | SUBCUTANEOUS | Status: DC
Start: 2014-10-16 — End: 2014-10-29

## 2014-10-16 NOTE — Progress Notes (Incomplete)
U/S for BPP scheduled for today 10/16/2014 @ 1:30PM. U/S for growth scheduled for 10/23/2014 @ 7:30AM

## 2014-10-16 NOTE — Progress Notes (Signed)
Subjective:  Makayla Meyer is a 24 y.o. G2P0010 at [redacted]w[redacted]d being seen today for ongoing prenatal care.  Patient reports sugars not well controlled in spite of taking medications and following diet.  Patient also c/o persistent yeast symptoms..  Contractions: Irritability.  Vag. Bleeding: None. Movement: Present. Denies leaking of fluid.   The following portions of the patient's history were reviewed and updated as appropriate: allergies, current medications, past family history, past medical history, past social history, past surgical history and problem list.   Objective:   Filed Vitals:   10/16/14 0825  BP: 108/72  Pulse: 94  Temp: 97.7 F (36.5 C)  Weight: 131 lb 4.8 oz (59.557 kg)    Fetal Status: Fetal Heart Rate (bpm): 169 Fundal Height: 34 cm Movement: Present     General:  Alert, oriented and cooperative. Patient is in no acute distress.  Skin: Skin is warm and dry. No rash noted.   Cardiovascular: Normal heart rate noted  Respiratory: Normal respiratory effort, no problems with respiration noted  Abdomen: Soft, gravid, appropriate for gestational age. Pain/Pressure: Absent     Pelvic: Vag. Bleeding: None Vag D/C Character: Mucous   Cervical exam deferred        Extremities: Normal range of motion.  Edema: Trace  Mental Status: Normal mood and affect. Normal behavior. Normal judgment and thought content.   Urinalysis: Urine Protein: Negative Urine Glucose: 4+  Assessment and Plan:  Pregnancy: G2P0010 at [redacted]w[redacted]d  1. Supervision of high risk pregnancy, antepartum, third trimester - US OB Follow Up; Future - [place] MFM FETAL BPP W/NONSTRESS; Future  2. Diabetes mellitus affecting pregnancy, antepartum Worsening Stop oral meds and start Novalog 10 units with each meal and NPH 8 units q am and 10 units qhs  3. Cervical shortening affecting pregnancy, antepartum, third trimester No preterm labor issus  4.  Vaginal candidiasis -Monistat 7  Preterm labor symptoms and general  obstetric precautions including but not limited to vaginal bleeding, contractions, leaking of fluid and fetal movement were reviewed in detail with the patient. Please refer to After Visit Summary for other counseling recommendations.  Return in about 1 week (around 10/23/2014).   Lesly Dukes, MD

## 2014-10-16 NOTE — Progress Notes (Signed)
Patient presents for insulin instruction. She will begin on NPH 8 units am and 10units qhs. Novolog 10 units with each meal. Patients mother is staying with her and she in T2DM insulin dependent. She will be able to assist with her injections. We reviewed location and technique for injection. Insulin schedule sheet provided. Patient noted that she had been following her diet. However discussion revealed bedtime snack of a whole banana and Grands Biscuit. I encouraged crackers and peanut butter/ 1/2 banana and peanut butter. She stated she had her nutrition guides at home and that she needed to review them.

## 2014-10-16 NOTE — Progress Notes (Signed)
Breastfeeding tip of the week reviewed Pt states she keeps a yeast infection, can she use cream she has at home? Ketones: trace, leukocytes: trace

## 2014-10-16 NOTE — Progress Notes (Deleted)
Ultrasound for growth and BPP scheduled for Monday 10/23/2014 @ 7:30AM

## 2014-10-17 ENCOUNTER — Telehealth: Payer: Self-pay | Admitting: *Deleted

## 2014-10-17 NOTE — Telephone Encounter (Signed)
Received message left on nurse line 10/17/14 at 1512.  Patient states she was seen in our office yesterday 10/16/14 by Dr. Penne Lash.  States Dr. Penne Lash was supposed to call in prescription for insulin pens and instead she called in vials.  Patient requests prescription be phoned in for insulin pens so she can start taking it.  Requests a call back.

## 2014-10-18 ENCOUNTER — Telehealth: Payer: Self-pay | Admitting: *Deleted

## 2014-10-18 NOTE — Telephone Encounter (Signed)
Received a call from [facility] pharmacy stating patient wants to have her novolog and humulin nph insulin changed to pen instead of syringe/vial.  States it is covered by her insurance. Order given to change nph to pen but novolog does not come in a pen.

## 2014-10-22 DIAGNOSIS — O403XX0 Polyhydramnios, third trimester, not applicable or unspecified: Secondary | ICD-10-CM | POA: Insufficient documentation

## 2014-10-23 ENCOUNTER — Encounter (HOSPITAL_COMMUNITY): Payer: Self-pay

## 2014-10-23 ENCOUNTER — Ambulatory Visit (HOSPITAL_COMMUNITY)
Admission: RE | Admit: 2014-10-23 | Discharge: 2014-10-23 | Disposition: A | Discharge status: Home / Self Care | Payer: Medicaid Other | Source: Ambulatory Visit | Attending: Obstetrics & Gynecology | Admitting: Obstetrics & Gynecology

## 2014-10-23 ENCOUNTER — Telehealth: Payer: Self-pay | Admitting: *Deleted

## 2014-10-23 ENCOUNTER — Inpatient Hospital Stay (HOSPITAL_COMMUNITY)
Admission: AD | Admit: 2014-10-23 | Discharge: 2014-10-23 | Disposition: A | Discharge status: Home / Self Care | Payer: Medicaid Other | Source: Ambulatory Visit | Attending: Obstetrics and Gynecology | Admitting: Obstetrics and Gynecology

## 2014-10-23 ENCOUNTER — Ambulatory Visit (INDEPENDENT_AMBULATORY_CARE_PROVIDER_SITE_OTHER): Payer: Medicaid Other | Admitting: Obstetrics & Gynecology

## 2014-10-23 VITALS — BP 115/73 | HR 101 | Wt 140.6 lb

## 2014-10-23 DIAGNOSIS — O24919 Unspecified diabetes mellitus in pregnancy, unspecified trimester: Secondary | ICD-10-CM

## 2014-10-23 DIAGNOSIS — O24313 Unspecified pre-existing diabetes mellitus in pregnancy, third trimester: Secondary | ICD-10-CM | POA: Insufficient documentation

## 2014-10-23 DIAGNOSIS — O403XX0 Polyhydramnios, third trimester, not applicable or unspecified: Secondary | ICD-10-CM | POA: Diagnosis present

## 2014-10-23 DIAGNOSIS — O4700 False labor before 37 completed weeks of gestation, unspecified trimester: Secondary | ICD-10-CM

## 2014-10-23 DIAGNOSIS — R809 Proteinuria, unspecified: Secondary | ICD-10-CM | POA: Insufficient documentation

## 2014-10-23 DIAGNOSIS — O2343 Unspecified infection of urinary tract in pregnancy, third trimester: Secondary | ICD-10-CM | POA: Insufficient documentation

## 2014-10-23 DIAGNOSIS — Z3A32 32 weeks gestation of pregnancy: Secondary | ICD-10-CM | POA: Insufficient documentation

## 2014-10-23 DIAGNOSIS — Z794 Long term (current) use of insulin: Secondary | ICD-10-CM | POA: Insufficient documentation

## 2014-10-23 DIAGNOSIS — E119 Type 2 diabetes mellitus without complications: Secondary | ICD-10-CM | POA: Insufficient documentation

## 2014-10-23 DIAGNOSIS — O26893 Other specified pregnancy related conditions, third trimester: Secondary | ICD-10-CM | POA: Insufficient documentation

## 2014-10-23 DIAGNOSIS — Z7982 Long term (current) use of aspirin: Secondary | ICD-10-CM | POA: Insufficient documentation

## 2014-10-23 DIAGNOSIS — N390 Urinary tract infection, site not specified: Secondary | ICD-10-CM

## 2014-10-23 DIAGNOSIS — O99820 Streptococcus B carrier state complicating pregnancy: Secondary | ICD-10-CM

## 2014-10-23 LAB — POCT URINALYSIS DIP (DEVICE)
Bilirubin Urine: NEGATIVE
GLUCOSE, UA: 500 mg/dL — AB
Hgb urine dipstick: NEGATIVE
Ketones, ur: >=160 mg/dL — AB
NITRITE: NEGATIVE
PROTEIN: NEGATIVE mg/dL
Specific Gravity, Urine: 1.01 (ref 1.005–1.030)
UROBILINOGEN UA: 0.2 mg/dL (ref 0.0–1.0)
pH: 5 (ref 5.0–8.0)

## 2014-10-23 LAB — CBC
HCT: 33.8 % — ABNORMAL LOW (ref 36.0–46.0)
HEMOGLOBIN: 11.5 g/dL — AB (ref 12.0–15.0)
MCH: 27.9 pg (ref 26.0–34.0)
MCHC: 34 g/dL (ref 30.0–36.0)
MCV: 82 fL (ref 78.0–100.0)
Platelets: 258 10*3/uL (ref 150–400)
RBC: 4.12 MIL/uL (ref 3.87–5.11)
RDW: 14.1 % (ref 11.5–15.5)
WBC: 12.2 10*3/uL — ABNORMAL HIGH (ref 4.0–10.5)

## 2014-10-23 LAB — BASIC METABOLIC PANEL
ANION GAP: 9 (ref 5–15)
ANION GAP: 9 (ref 5–15)
BUN: 12 mg/dL (ref 6–20)
BUN: 10 mg/dL (ref 6–20)
CALCIUM: 8.5 mg/dL — AB (ref 8.9–10.3)
CHLORIDE: 106 mmol/L (ref 101–111)
CO2: 15 mmol/L — AB (ref 22–32)
CO2: 17 mmol/L — AB (ref 22–32)
Calcium: 8.3 mg/dL — ABNORMAL LOW (ref 8.9–10.3)
Chloride: 104 mmol/L (ref 101–111)
Creatinine, Ser: 0.55 mg/dL (ref 0.44–1.00)
Creatinine, Ser: 0.39 mg/dL — ABNORMAL LOW (ref 0.44–1.00)
GFR calc Af Amer: >60 mL/min (ref >60)
GFR calc non Af Amer: >60 mL/min (ref >60)
Glucose, Bld: 209 mg/dL — ABNORMAL HIGH (ref 65–99)
Glucose, Bld: 162 mg/dL — ABNORMAL HIGH (ref 65–99)
POTASSIUM: 3.4 mmol/L — AB (ref 3.5–5.1)
Potassium: 6.1 mmol/L (ref 3.5–5.1)
Sodium: 128 mmol/L — ABNORMAL LOW (ref 135–145)
Sodium: 132 mmol/L — ABNORMAL LOW (ref 135–145)

## 2014-10-23 LAB — URINALYSIS, ROUTINE W REFLEX MICROSCOPIC
Bilirubin Urine: NEGATIVE
GLUCOSE, UA: 500 mg/dL — AB
NITRITE: NEGATIVE
PH: 6 (ref 5.0–8.0)
Protein, ur: NEGATIVE mg/dL
Specific Gravity, Urine: >1.03 — ABNORMAL HIGH (ref 1.005–1.030)
Urobilinogen, UA: 0.2 mg/dL (ref 0.0–1.0)

## 2014-10-23 LAB — RAPID URINE DRUG SCREEN, HOSP PERFORMED
Amphetamines: NOT DETECTED
Barbiturates: NOT DETECTED
Benzodiazepines: NOT DETECTED
Cocaine: NOT DETECTED
OPIATES: NOT DETECTED
TETRAHYDROCANNABINOL: NOT DETECTED

## 2014-10-23 LAB — URINE MICROSCOPIC-ADD ON

## 2014-10-23 LAB — PROTEIN / CREATININE RATIO, URINE
CREATININE, URINE: 109 mg/dL
PROTEIN CREATININE RATIO: 0.4 mg/mg{creat} — AB (ref 0.00–0.15)
TOTAL PROTEIN, URINE: 44 mg/dL

## 2014-10-23 LAB — GLUCOSE, CAPILLARY: GLUCOSE-CAPILLARY: 163 mg/dL — AB (ref 65–99)

## 2014-10-23 MED ORDER — BETAMETHASONE SOD PHOS & ACET 6 (3-3) MG/ML IJ SUSP
12.0000 mg | Freq: Once | INTRAMUSCULAR | Status: AC
  Administered 2014-10-23: 12 mg via INTRAMUSCULAR
  Filled 2014-10-23: qty 2

## 2014-10-23 MED ORDER — FLUCONAZOLE 150 MG PO TABS: ORAL_TABLET | ORAL | Status: DC

## 2014-10-23 MED ORDER — LACTATED RINGERS IV BOLUS (SEPSIS)
1000.0000 mL | Freq: Once | INTRAVENOUS | Status: AC
  Administered 2014-10-23: 1000 mL via INTRAVENOUS

## 2014-10-23 MED ORDER — TERBUTALINE SULFATE 1 MG/ML IJ SOLN
0.2500 mg | Freq: Once | INTRAMUSCULAR | Status: AC
  Administered 2014-10-23: 0.25 mg via SUBCUTANEOUS
  Filled 2014-10-23: qty 1

## 2014-10-23 MED ORDER — SODIUM CHLORIDE 0.9 % IV BOLUS (SEPSIS): 1000.0000 mL | Freq: Once | INTRAVENOUS | Status: DC

## 2014-10-23 MED ORDER — CEPHALEXIN 500 MG PO CAPS: 500.0000 mg | ORAL_CAPSULE | Freq: Four times a day (QID) | ORAL | Status: DC

## 2014-10-23 MED ORDER — NIFEDIPINE 10 MG PO CAPS
10.0000 mg | ORAL_CAPSULE | ORAL | Status: AC
  Administered 2014-10-23 (×3): 10 mg via ORAL
  Filled 2014-10-23 (×3): qty 1

## 2014-10-23 NOTE — Progress Notes (Signed)
NST reactive Subjective:has sx yeast infection, was seen in MAU for contractions  Makayla Meyer is a 24 y.o. G2P0010 at [redacted]w[redacted]d being seen today for ongoing prenatal care.  Patient reports vaginal irritation.  Contractions: Irregular.  Vag. Bleeding: None. Movement: Present. Denies leaking of fluid.   The following portions of the patient's history were reviewed and updated as appropriate: allergies, current medications, past family history, past medical history, past social history, past surgical history and problem list.   Objective:   Filed Vitals:   10/23/14 1536  BP: 115/73  Pulse: 101  Weight: 140 lb 9.6 oz (63.776 kg)    Fetal Status: Fetal Heart Rate (bpm): NST   Movement: Present     General:  Alert, oriented and cooperative. Patient is in no acute distress.  Skin: Skin is warm and dry. No rash noted.   Cardiovascular: Normal heart rate noted  Respiratory: Normal respiratory effort, no problems with respiration noted  Abdomen: Soft, gravid, appropriate for gestational age. Pain/Pressure: Present     Pelvic: Vag. Bleeding: None Vag D/C Character: Mucous   Cervical exam performed        Extremities: Normal range of motion.  Edema: Mild pitting, slight indentation  Mental Status: Normal mood and affect. Normal behavior. Normal judgment and thought content.   Urinalysis: Urine Protein: Negative Urine Glucose: 3+  Assessment and Plan:  Pregnancy: G2P0010 at [redacted]w[redacted]d  1. Polyhydramnios in third trimester, antepartum, not applicable or unspecified fetus  - Fetal nonstress test; Future  2. Diabetes mellitus affecting pregnancy, antepartum Did not bring her record today - Fetal nonstress test; Future NST reactive  Preterm labor symptoms and general obstetric precautions including but not limited to vaginal bleeding, contractions, leaking of fluid and fetal movement were reviewed in detail with the patient. Please refer to After Visit Summary for other counseling recommendations.   Return in about 7 days (around 10/30/2014) for nst 11am. Diflucan for yeast sx  Adam Phenix, MD

## 2014-10-23 NOTE — Telephone Encounter (Signed)
Rescheduled MFM appointment for Thursday, October 6 @ 3pm.  Attempted to contact patient, no answer, left message that we rescheduled MFM appointment for 10/26/14 @ 3:00pm.  Will message front office to reschedule missed appointment in clinic.

## 2014-10-23 NOTE — MAU Note (Signed)
Pt reports contractions, denies bleeding or ROM.  

## 2014-10-23 NOTE — Telephone Encounter (Signed)
[name] to review chart.

## 2014-10-23 NOTE — Progress Notes (Signed)
CRITICAL VALUE ALERT  Critical value received:  6.1 potassium  Date of notification: 10/23/2014  Time of notification: 0850  Critical value read back:yes  Nurse who received alert:  Morrison Old RN  MD notified (1st page): Dr Sampson Goon  Time of first page:  0850  MD notified (2nd page):na  Time of second page:na  Responding MD: Dr Sampson Goon  Time MD responded: na

## 2014-10-23 NOTE — MAU Provider Note (Signed)
Chief Complaint:  Contractions  HPI: Makayla Meyer is a 24 y.o. G2P0010 at 25w4dwho presents to maternity admissions reporting contractions and abdominal discomfort. Patient states that she began experiencing these around midnight. [place] some abdominal tightness towards the end of the night. This then graduated to some contractions around midnight. She states that she had been keeping her fluid intake high throughout the day but has not drank much water [place] She denies any leakage of fluid or vaginal bleeding. No fevers chills nausea vomiting or diarrhea. No headaches vision changes or shortness of breath.   Patient has been seen in the past for preterm labor. She is being followed regularly for shortened cervix. Today she has an appointment to have a repeat ultrasound for this shortened cervix. She is s/p betamethasone, and is taking Procardia twice a day at home.   No dysuria or fevers or flank pain.  Normal fetal movement.    Past Medical History: Past Medical History  Diagnosis Date  . [place]   . UTI (lower urinary tract infection)     Past obstetric history: OB History  Gravida Para Term Preterm AB SAB TAB Ectopic Multiple Living  2    1 1         # Outcome Date GA Lbr Len/2nd Weight Sex Delivery Anes PTL Lv  2 Current           1 SAB 2013 [id number]           Past Surgical History: Past Surgical History  Procedure Laterality Date  . No past surgeries       Family History: Family History  Problem Relation Age of Onset  . Hypertension Mother   . Asthma Mother   . Diabetes Mother   . Depression Mother   . Diabetes Sister     Social History: Social History  Substance Use Topics  . Smoking status: Never Smoker   . Smokeless tobacco: Never Used  . Alcohol Use: Yes     Comment: before pregnancy    Allergies: No Known Allergies  Meds:   Prescriptions prior to admission  Medication Sig Dispense Refill Last Dose  . aspirin 81 MG chewable tablet Chew 1 tablet (81 mg total) by mouth daily. 90 tablet 3 10/22/2014 at Unknown time  . insulin aspart (NOVOLOG) 100 UNIT/ML injection Inject 10 Units into the skin 3 (three) times daily before meals. 10 mL 11 10/22/2014 at Unknown time  . insulin NPH Human (HUMULIN N,NOVOLIN N) 100 UNIT/ML injection Take 8 units before breakfast and 10 units each night 10 mL 3 10/22/2014 at Unknown time  . NIFEdipine (PROCARDIA-XL/ADALAT CC) 30 MG 24 hr tablet Take 1 tablet (30 mg total) by mouth 2 (two) times daily. 60 tablet 4 10/23/2014 at Unknown time  . prenatal vitamin w/FE, FA (PRENATAL 1 + 1) 27-1 MG TABS tablet Take 1 tablet by mouth daily at 12 noon. (Patient taking differently: Take 1 tablet by mouth at bedtime. ) 30 each 10 10/22/2014 at Unknown time  . progesterone (PROMETRIUM) 200 MG capsule Place 200 mg vaginally at bedtime.    Past Week at Unknown time  . ACCU-CHEK FASTCLIX LANCETS MISC 1 Units by Does not apply route 4 (four) times daily. 102 each 3 Taking  . Blood Glucose Monitoring Suppl (ACCU-CHEK NANO SMARTVIEW) W/DEVICE KIT 1 Device by Does not apply route 4 (  four) times daily. 1 kit 0 Taking  . glucose blood (ACCU-CHEK SMARTVIEW) test strip 1 strip four times daily 51 each 12 Taking    ROS: Pertinent findings in history of present illness.  Physical Exam  Blood pressure 110/70, pulse 100, temperature 98.4 F (36.9 C), temperature source Oral, resp. rate 18, last menstrual period 03/09/2014, SpO2 100 %. GENERAL: Well-developed, well-nourished female in no acute distress. Some discomfort w/ contractions.  HEENT: normocephalic HEART: normal rate RESP: normal effort ABDOMEN: Soft, non-tender, gravid appropriate for gestational age EXTREMITIES: Nontender, no edema NEURO: alert and oriented  Dilation: Closed Effacement (%): 70 Cervical Position: Anterior Exam by:: Amber Stovall  RN  FHT:  Baseline 140 , moderate variability, accelerations present, no decelerations Contractions: q 2-3 mins   Labs: Results for orders placed or performed during the hospital encounter of 10/23/14 (from the past 24 hour(s))  Glucose, capillary     Status: Abnormal   Collection Time: 10/23/14  5:43 AM  Result Value Ref Range   Glucose-Capillary 163 (H) 65 - 99 mg/dL  Urinalysis, Routine w reflex microscopic (not at [facility])     Status: Abnormal   Collection Time: 10/23/14  7:20 AM  Result Value Ref Range   Color, Urine YELLOW YELLOW   APPearance CLOUDY (A) CLEAR   Specific Gravity, Urine >1.030 (H) 1.005 - 1.030   pH 6.0 5.0 - 8.0   Glucose, UA 500 (A) NEGATIVE mg/dL   Hgb urine dipstick TRACE (A) NEGATIVE   Bilirubin Urine NEGATIVE NEGATIVE   Ketones, ur >80 (A) NEGATIVE mg/dL   Protein, ur NEGATIVE NEGATIVE mg/dL   Urobilinogen, UA 0.2 0.0 - 1.0 mg/dL   Nitrite NEGATIVE NEGATIVE   Leukocytes, UA SMALL (A) NEGATIVE  Urine microscopic-add on     Status: Abnormal   Collection Time: 10/23/14  7:20 AM  Result Value Ref Range   Squamous Epithelial / LPF FEW (A) RARE   WBC, UA 11-20 <3 WBC/hpf   RBC / HPF 0-2 <3 RBC/hpf   Bacteria, UA FEW (A) RARE  Urine rapid drug screen (hosp performed)     Status: None   Collection Time: 10/23/14  7:22 AM  Result Value Ref Range   Opiates NONE DETECTED NONE DETECTED   Cocaine NONE DETECTED NONE DETECTED   Benzodiazepines NONE DETECTED NONE DETECTED   Amphetamines NONE DETECTED NONE DETECTED   Tetrahydrocannabinol NONE DETECTED NONE DETECTED   Barbiturates NONE DETECTED NONE DETECTED  Protein / creatinine ratio, urine     Status: Abnormal   Collection Time: 10/23/14  7:22 AM  Result Value Ref Range   Creatinine, Urine 109.00 mg/dL   Total Protein, Urine 44 mg/dL   Protein Creatinine Ratio 0.40 (H) 0.00 - 0.15 mg/mg[Cre]  CBC     Status: Abnormal   Collection Time: 10/23/14  7:45 AM  Result Value Ref Range   WBC 12.2 (H) 4.0 - 10.5 K/uL    RBC 4.12 3.87 - 5.11 MIL/uL   Hemoglobin 11.5 (L) 12.0 - 15.0 g/dL   HCT 33.8 (L) 36.0 - 46.0 %   MCV 82.0 78.0 - 100.0 fL   MCH 27.9 26.0 - 34.0 pg   MCHC 34.0 30.0 - 36.0 g/dL   RDW 14.1 11.5 - 15.5 %   Platelets 258 150 - 400 K/uL  Basic metabolic panel     Status: Abnormal   Collection Time: 10/23/14  7:45 AM  Result Value Ref Range   Sodium 128 (L) 135 - 145 mmol/L   Potassium 6.1 (  HH) 3.5 - 5.1 mmol/L   Chloride 104 101 - 111 mmol/L   CO2 15 (L) 22 - 32 mmol/L   Glucose, Bld 209 (H) 65 - 99 mg/dL   BUN 12 6 - 20 mg/dL   Creatinine, Ser 0.55 0.44 - 1.00 mg/dL   Calcium 8.5 (L) 8.9 - 10.3 mg/dL   GFR calc non Af Amer >60 >60 mL/min   GFR calc Af Amer >60 >60 mL/min   Anion gap 9 5 - 15  Basic metabolic panel     Status: Abnormal   Collection Time: 10/23/14  9:26 AM  Result Value Ref Range   Sodium 132 (L) 135 - 145 mmol/L   Potassium 3.4 (L) 3.5 - 5.1 mmol/L   Chloride 106 101 - 111 mmol/L   CO2 17 (L) 22 - 32 mmol/L   Glucose, Bld 162 (H) 65 - 99 mg/dL   BUN 10 6 - 20 mg/dL   Creatinine, Ser 0.39 (L) 0.44 - 1.00 mg/dL   Calcium 8.3 (L) 8.9 - 10.3 mg/dL   GFR calc non Af Amer >60 >60 mL/min   GFR calc Af Amer >60 >60 mL/min   Anion gap 9 5 - 15    Imaging:  [place] Mfm Fetal Bpp W/nonstress  10/16/2014   OBSTETRICAL ULTRASOUND: This exam was performed within a [facility]. The OB US report was generated in the [facility] system, and faxed to the ordering physician.   This report is available in the [employer]. See the [facility] Obstetric US report via the Image Link.   Assessment/Plan: 1. Threatened preterm labor - here contracting frequently, initially painful, cervix closed. After 2 liters fluid and procardia still contracting but not feeling those contractions. No signs PPROM. Is s/p bmz x2 a little over a month ago. No symptoms infection or DKA, but urinalysis suggestive of possible infection. Reactive nst. - rescue single dose BMZ - terbutaline x1 -  keflex 250 mg po q6 hours for 5 days - ptl/pprom return precautions - continue vaginal progesterone  # UTI - asymptomatic, but urinalysis suggestive, and could be cause of threatened ptl - keflex q6 hours, f/u urine culture  2. Polyhydramnios - missed growth scan today, will re-schedule for this week  3. Proteinuria - upc 0.4, no prior hx proteinuria, does have pre-gestational dm. No elevated BPs or other labs suggestive of preeclampsia - home with 24-hour urine collection  4. Pre-gestational DM - clinic f/u this week as needs twice-weekly antenatal testing. Asking our clinical pool to help re-schedule.      Follow-up Information    Schedule an appointment as soon as possible for a visit with [facility].   Specialty:  Obstetrics and Gynecology   Contact information:   Alma Emsworth [phone]       Medication List    TAKE these medications        ACCU-CHEK FASTCLIX LANCETS Misc  1 Units by Does not apply route 4 (four) times daily.     ACCU-CHEK NANO SMARTVIEW W/DEVICE Kit  1 Device by Does not apply route 4 (four) times daily.     aspirin 81 MG chewable tablet  Chew 1 tablet (81 mg total) by mouth daily.     cephALEXin 500 MG capsule  Commonly known as:  KEFLEX  Take 1 capsule (500 mg total) by mouth 4 (four) times daily.     glucose blood test strip  Commonly known as:  ACCU-CHEK SMARTVIEW  1 strip four times daily     insulin aspart 100 UNIT/ML injection  Commonly known as:  NOVOLOG  Inject 10 Units into the skin 3 (three) times daily before meals.     insulin NPH Human 100 UNIT/ML injection  Commonly known as:  HUMULIN N,NOVOLIN N  Take 8 units before breakfast and 10 units each night     NIFEdipine 30 MG 24 hr tablet  Commonly known as:  PROCARDIA-XL/ADALAT CC  Take 1 tablet (30 mg total) by mouth 2 (two) times daily.     prenatal vitamin w/FE, FA 27-1 MG Tabs tablet  Take 1 tablet by mouth daily  at 12 noon.     progesterone 200 MG capsule  Commonly known as:  PROMETRIUM  Place 200 mg vaginally at bedtime.        Gwynne Edinger, MD 10/23/2014 11:39 AM

## 2014-10-23 NOTE — Telephone Encounter (Signed)
[name] contacted pharmacy, change has been made to insulin, pharmacy states patient has already picked up insulin pens.  Attempted to contact patient to insure that patient has correct medication. Left message for patient to call clinic if she has not received her insulin pens.

## 2014-10-23 NOTE — Patient Instructions (Signed)
Third Trimester of Pregnancy The third trimester is from week 29 through week 42, months 7 through 9. The third trimester is a time when the fetus is growing rapidly. At the end of the ninth month, the fetus is about 20 inches in length and weighs 6-10 pounds.  BODY CHANGES Your body goes through many changes during pregnancy. The changes vary from woman to woman.   Your weight will continue to increase. You can expect to gain 25-35 pounds (11-16 kg) by the end of the pregnancy.  You may begin to get stretch marks on your hips, abdomen, and breasts.  You may urinate more often because the fetus is moving lower into your pelvis and pressing on your bladder.  You may develop or continue to have heartburn as a result of your pregnancy.  You may develop constipation because certain hormones are causing the muscles that push waste through your intestines to slow down.  You may develop hemorrhoids or swollen, bulging veins (varicose veins).  You may have pelvic pain because of the weight gain and pregnancy hormones relaxing your joints between the bones in your pelvis. Backaches may result from overexertion of the muscles supporting your posture.  You may have changes in your hair. These can include thickening of your hair, rapid growth, and changes in texture. Some women also have hair loss during or after pregnancy, or hair that feels dry or thin. Your hair will most likely return to normal after your baby is born.  Your breasts will continue to grow and be tender. A yellow discharge may leak from your breasts called colostrum.  Your belly button may stick out.  You may feel short of breath because of your expanding uterus.  You may notice the fetus "dropping," or moving lower in your abdomen.  You may have a bloody mucus discharge. This usually occurs a few days to a week before labor begins.  Your cervix becomes thin and soft (effaced) near your due date. WHAT TO EXPECT AT YOUR PRENATAL  EXAMS  You will have prenatal exams every 2 weeks until week 36. Then, you will have weekly prenatal exams. During a routine prenatal visit:  You will be weighed to make sure you and the fetus are growing normally.  Your blood pressure is taken.  Your abdomen will be measured to track your baby's growth.  The fetal heartbeat will be listened to.  Any test results from the previous visit will be discussed.  You may have a cervical check near your due date to see if you have effaced. At around 36 weeks, your caregiver will check your cervix. At the same time, your caregiver will also perform a test on the secretions of the vaginal tissue. This test is to determine if a type of bacteria, Group B streptococcus, is present. Your caregiver will explain this further. Your caregiver may ask you:  What your birth plan is.  How you are feeling.  If you are feeling the baby move.  If you have had any abnormal symptoms, such as leaking fluid, bleeding, severe headaches, or abdominal cramping.  If you have any questions. Other tests or screenings that may be performed during your third trimester include:  Blood tests that check for low iron levels (anemia).  Fetal testing to check the health, activity level, and growth of the fetus. Testing is done if you have certain medical conditions or if there are problems during the pregnancy. FALSE LABOR You may feel small, irregular contractions that   eventually go away. These are called Braxton Hicks contractions, or false labor. Contractions may last for hours, days, or even weeks before true labor sets in. If contractions come at regular intervals, intensify, or become painful, it is best to be seen by your caregiver.  SIGNS OF LABOR   Menstrual-like cramps.  Contractions that are 5 minutes apart or less.  Contractions that start on the top of the uterus and spread down to the lower abdomen and back.  A sense of increased pelvic pressure or back  pain.  A watery or bloody mucus discharge that comes from the vagina. If you have any of these signs before the 37th week of pregnancy, call your caregiver right away. You need to go to the hospital to get checked immediately. HOME CARE INSTRUCTIONS   Avoid all smoking, herbs, alcohol, and unprescribed drugs. These chemicals affect the formation and growth of the baby.  Follow your caregiver's instructions regarding medicine use. There are medicines that are either safe or unsafe to take during pregnancy.  Exercise only as directed by your caregiver. Experiencing uterine cramps is a good sign to stop exercising.  Continue to eat regular, healthy meals.  Wear a good support bra for breast tenderness.  Do not use hot tubs, steam rooms, or saunas.  Wear your seat belt at all times when driving.  Avoid raw meat, uncooked cheese, cat litter boxes, and soil used by cats. These carry germs that can cause birth defects in the baby.  Take your prenatal vitamins.  Try taking a stool softener (if your caregiver approves) if you develop constipation. Eat more high-fiber foods, such as fresh vegetables or fruit and whole grains. Drink plenty of fluids to keep your urine clear or pale yellow.  Take warm sitz baths to soothe any pain or discomfort caused by hemorrhoids. Use hemorrhoid cream if your caregiver approves.  If you develop varicose veins, wear support hose. Elevate your feet for 15 minutes, 3-4 times a day. Limit salt in your diet.  Avoid heavy lifting, wear low heal shoes, and practice good posture.  Rest a lot with your legs elevated if you have leg cramps or low back pain.  Visit your dentist if you have not gone during your pregnancy. Use a soft toothbrush to brush your teeth and be gentle when you floss.  A sexual relationship may be continued unless your caregiver directs you otherwise.  Do not travel far distances unless it is absolutely necessary and only with the approval  of your caregiver.  Take prenatal classes to understand, practice, and ask questions about the labor and delivery.  Make a trial run to the hospital.  Pack your hospital bag.  Prepare the baby's nursery.  Continue to go to all your prenatal visits as directed by your caregiver. SEEK MEDICAL CARE IF:  You are unsure if you are in labor or if your water has broken.  You have dizziness.  You have mild pelvic cramps, pelvic pressure, or nagging pain in your abdominal area.  You have persistent nausea, vomiting, or diarrhea.  You have a bad smelling vaginal discharge.  You have pain with urination. SEEK IMMEDIATE MEDICAL CARE IF:   You have a fever.  You are leaking fluid from your vagina.  You have spotting or bleeding from your vagina.  You have severe abdominal cramping or pain.  You have rapid weight loss or gain.  You have shortness of breath with chest pain.  You notice sudden or extreme swelling   of your face, hands, ankles, feet, or legs.  You have not felt your baby move in over an hour.  You have severe headaches that do not go away with medicine.  You have vision changes. Document Released: 12/31/2000 Document Revised: 01/11/2013 Document Reviewed: 03/09/2012 Exit[employer] Patient Information 2015 [employer]. This information is not intended to replace advice given to you by your health care provider. Make sure you discuss any questions you have with your health care provider.  

## 2014-10-24 LAB — CULTURE, OB URINE

## 2014-10-24 LAB — GC/CHLAMYDIA PROBE AMP (~~LOC~~) NOT AT ARMC
Chlamydia: NEGATIVE
Neisseria Gonorrhea: NEGATIVE

## 2014-10-25 ENCOUNTER — Other Ambulatory Visit (HOSPITAL_COMMUNITY)
Admission: AD | Admit: 2014-10-25 | Discharge: 2014-10-25 | Disposition: A | Discharge status: Home / Self Care | Payer: Medicaid Other | Source: Ambulatory Visit | Attending: Obstetrics and Gynecology | Admitting: Obstetrics and Gynecology

## 2014-10-25 ENCOUNTER — Other Ambulatory Visit: Payer: Self-pay | Admitting: Obstetrics & Gynecology

## 2014-10-25 DIAGNOSIS — O0993 Supervision of high risk pregnancy, unspecified, third trimester: Secondary | ICD-10-CM

## 2014-10-25 DIAGNOSIS — N390 Urinary tract infection, site not specified: Secondary | ICD-10-CM | POA: Diagnosis not present

## 2014-10-25 LAB — PROTEIN, URINE, 24 HOUR
Collection Interval-UPROT: 24 hours
Protein, 24H Urine: 421 mg/d — ABNORMAL HIGH (ref 50–100)
Protein, Urine: 11 mg/dL
Urine Total Volume-UPROT: 3830 mL

## 2014-10-26 ENCOUNTER — Inpatient Hospital Stay (HOSPITAL_COMMUNITY)
Admission: AD | Admit: 2014-10-26 | Discharge: 2014-10-29 | DRG: 774 | Disposition: A | Discharge status: Home / Self Care | Payer: Medicaid Other | Source: Ambulatory Visit | Attending: Family Medicine | Admitting: Family Medicine

## 2014-10-26 ENCOUNTER — Encounter (HOSPITAL_COMMUNITY): Payer: Self-pay

## 2014-10-26 ENCOUNTER — Ambulatory Visit (HOSPITAL_COMMUNITY)
Admit: 2014-10-26 | Discharge: 2014-10-26 | Disposition: A | Discharge status: Home / Self Care | Payer: Medicaid Other | Attending: Obstetrics & Gynecology | Admitting: Obstetrics & Gynecology

## 2014-10-26 ENCOUNTER — Encounter (HOSPITAL_COMMUNITY): Payer: Self-pay | Admitting: *Deleted

## 2014-10-26 ENCOUNTER — Inpatient Hospital Stay (HOSPITAL_COMMUNITY): Payer: Medicaid Other

## 2014-10-26 ENCOUNTER — Other Ambulatory Visit: Payer: Self-pay | Admitting: Obstetrics & Gynecology

## 2014-10-26 DIAGNOSIS — O9989 Other specified diseases and conditions complicating pregnancy, childbirth and the puerperium: Secondary | ICD-10-CM | POA: Diagnosis not present

## 2014-10-26 DIAGNOSIS — O4703 False labor before 37 completed weeks of gestation, third trimester: Secondary | ICD-10-CM

## 2014-10-26 DIAGNOSIS — O403XX0 Polyhydramnios, third trimester, not applicable or unspecified: Secondary | ICD-10-CM | POA: Diagnosis present

## 2014-10-26 DIAGNOSIS — O24313 Unspecified pre-existing diabetes mellitus in pregnancy, third trimester: Secondary | ICD-10-CM

## 2014-10-26 DIAGNOSIS — O26873 Cervical shortening, third trimester: Secondary | ICD-10-CM

## 2014-10-26 DIAGNOSIS — Z3A33 33 weeks gestation of pregnancy: Secondary | ICD-10-CM | POA: Diagnosis not present

## 2014-10-26 DIAGNOSIS — Z794 Long term (current) use of insulin: Secondary | ICD-10-CM | POA: Diagnosis not present

## 2014-10-26 DIAGNOSIS — O479 False labor, unspecified: Secondary | ICD-10-CM | POA: Insufficient documentation

## 2014-10-26 DIAGNOSIS — O24919 Unspecified diabetes mellitus in pregnancy, unspecified trimester: Secondary | ICD-10-CM | POA: Diagnosis present

## 2014-10-26 DIAGNOSIS — O352XX0 Maternal care for (suspected) hereditary disease in fetus, not applicable or unspecified: Secondary | ICD-10-CM

## 2014-10-26 DIAGNOSIS — O99820 Streptococcus B carrier state complicating pregnancy: Secondary | ICD-10-CM

## 2014-10-26 DIAGNOSIS — O24113 Pre-existing diabetes mellitus, type 2, in pregnancy, third trimester: Secondary | ICD-10-CM | POA: Diagnosis present

## 2014-10-26 DIAGNOSIS — E119 Type 2 diabetes mellitus without complications: Secondary | ICD-10-CM | POA: Diagnosis present

## 2014-10-26 DIAGNOSIS — Z833 Family history of diabetes mellitus: Secondary | ICD-10-CM

## 2014-10-26 DIAGNOSIS — Z8249 Family history of ischemic heart disease and other diseases of the circulatory system: Secondary | ICD-10-CM | POA: Diagnosis not present

## 2014-10-26 DIAGNOSIS — R51 Headache: Secondary | ICD-10-CM

## 2014-10-26 DIAGNOSIS — R519 Headache, unspecified: Secondary | ICD-10-CM

## 2014-10-26 DIAGNOSIS — O0993 Supervision of high risk pregnancy, unspecified, third trimester: Secondary | ICD-10-CM

## 2014-10-26 DIAGNOSIS — O4700 False labor before 37 completed weeks of gestation, unspecified trimester: Secondary | ICD-10-CM | POA: Diagnosis present

## 2014-10-26 DIAGNOSIS — O26879 Cervical shortening, unspecified trimester: Secondary | ICD-10-CM | POA: Diagnosis present

## 2014-10-26 DIAGNOSIS — O0990 Supervision of high risk pregnancy, unspecified, unspecified trimester: Secondary | ICD-10-CM

## 2014-10-26 LAB — COMPREHENSIVE METABOLIC PANEL
ALT: 12 U/L — AB (ref 14–54)
AST: 14 U/L — AB (ref 15–41)
Albumin: 2.8 g/dL — ABNORMAL LOW (ref 3.5–5.0)
Alkaline Phosphatase: 126 U/L (ref 38–126)
Anion gap: 8 (ref 5–15)
BILIRUBIN TOTAL: 0.5 mg/dL (ref 0.3–1.2)
BUN: 8 mg/dL (ref 6–20)
CALCIUM: 8.8 mg/dL — AB (ref 8.9–10.3)
CO2: 21 mmol/L — ABNORMAL LOW (ref 22–32)
CREATININE: 0.4 mg/dL — AB (ref 0.44–1.00)
Chloride: 107 mmol/L (ref 101–111)
GFR calc Af Amer: >60 mL/min (ref >60)
Glucose, Bld: 198 mg/dL — ABNORMAL HIGH (ref 65–99)
POTASSIUM: 3.9 mmol/L (ref 3.5–5.1)
Sodium: 136 mmol/L (ref 135–145)
TOTAL PROTEIN: 6.3 g/dL — AB (ref 6.5–8.1)

## 2014-10-26 LAB — URINE MICROSCOPIC-ADD ON

## 2014-10-26 LAB — CBC
HEMATOCRIT: 32.8 % — AB (ref 36.0–46.0)
HEMOGLOBIN: 10.9 g/dL — AB (ref 12.0–15.0)
MCH: 27.1 pg (ref 26.0–34.0)
MCHC: 33.2 g/dL (ref 30.0–36.0)
MCV: 81.6 fL (ref 78.0–100.0)
Platelets: 278 10*3/uL (ref 150–400)
RBC: 4.02 MIL/uL (ref 3.87–5.11)
RDW: 13.9 % (ref 11.5–15.5)
WBC: 11.4 10*3/uL — AB (ref 4.0–10.5)

## 2014-10-26 LAB — URINALYSIS, ROUTINE W REFLEX MICROSCOPIC
Bilirubin Urine: NEGATIVE
HGB URINE DIPSTICK: NEGATIVE
KETONES UR: NEGATIVE mg/dL
Leukocytes, UA: NEGATIVE
Nitrite: NEGATIVE
PROTEIN: NEGATIVE mg/dL
Specific Gravity, Urine: 1.02 (ref 1.005–1.030)
Urobilinogen, UA: 0.2 mg/dL (ref 0.0–1.0)
pH: 6 (ref 5.0–8.0)

## 2014-10-26 LAB — TYPE AND SCREEN
ABO/RH(D): A POS
ANTIBODY SCREEN: NEGATIVE

## 2014-10-26 LAB — FETAL FIBRONECTIN: Fetal Fibronectin: NEGATIVE

## 2014-10-26 LAB — GLUCOSE, CAPILLARY
GLUCOSE-CAPILLARY: 127 mg/dL — AB (ref 65–99)
Glucose-Capillary: 192 mg/dL — ABNORMAL HIGH (ref 65–99)

## 2014-10-26 MED ORDER — NIFEDIPINE ER OSMOTIC RELEASE 30 MG PO TB24
30.0000 mg | ORAL_TABLET | Freq: Once | ORAL | Status: AC
  Administered 2014-10-26: 30 mg via ORAL
  Filled 2014-10-26: qty 1

## 2014-10-26 MED ORDER — ZOLPIDEM TARTRATE 5 MG PO TABS: 5.0000 mg | ORAL_TABLET | Freq: Every evening | ORAL | Status: DC | PRN

## 2014-10-26 MED ORDER — PROGESTERONE MICRONIZED 200 MG PO CAPS
200.0000 mg | ORAL_CAPSULE | Freq: Every day | ORAL | Status: DC
  Administered 2014-10-26 – 2014-10-28 (×3): 200 mg via VAGINAL
  Filled 2014-10-26 (×3): qty 1

## 2014-10-26 MED ORDER — NIFEDIPINE ER OSMOTIC RELEASE 30 MG PO TB24
30.0000 mg | ORAL_TABLET | Freq: Two times a day (BID) | ORAL | Status: DC
Start: 2014-10-27 — End: 2014-10-29
  Administered 2014-10-27 – 2014-10-29 (×5): 30 mg via ORAL
  Filled 2014-10-26 (×7): qty 1

## 2014-10-26 MED ORDER — CEPHALEXIN 500 MG PO CAPS
500.0000 mg | ORAL_CAPSULE | Freq: Four times a day (QID) | ORAL | Status: DC
  Administered 2014-10-26 – 2014-10-29 (×11): 500 mg via ORAL
  Filled 2014-10-26 (×15): qty 1

## 2014-10-26 MED ORDER — INSULIN NPH (HUMAN) (ISOPHANE) 100 UNIT/ML ~~LOC~~ SUSP
10.0000 [IU] | Freq: Once | SUBCUTANEOUS | Status: AC
  Administered 2014-10-26: 10 [IU] via SUBCUTANEOUS

## 2014-10-26 MED ORDER — ACETAMINOPHEN 325 MG PO TABS: 650.0000 mg | ORAL_TABLET | ORAL | Status: DC | PRN

## 2014-10-26 MED ORDER — INSULIN NPH (HUMAN) (ISOPHANE) 100 UNIT/ML ~~LOC~~ SUSP
8.0000 [IU] | Freq: Every day | SUBCUTANEOUS | Status: DC
  Administered 2014-10-27: 8 [IU] via SUBCUTANEOUS
  Filled 2014-10-26: qty 10

## 2014-10-26 MED ORDER — BUTALBITAL-APAP-CAFFEINE 50-325-40 MG PO TABS
1.0000 | ORAL_TABLET | Freq: Once | ORAL | Status: AC
  Administered 2014-10-26: 1 via ORAL
  Filled 2014-10-26: qty 1

## 2014-10-26 MED ORDER — MAGNESIUM SULFATE 50 % IJ SOLN
2.0000 g/h | INTRAVENOUS | Status: DC
  Administered 2014-10-26: 2 g/h via INTRAVENOUS
  Filled 2014-10-26: qty 80

## 2014-10-26 MED ORDER — LACTATED RINGERS IV SOLN
INTRAVENOUS | Status: DC
  Administered 2014-10-26: 23:00:00 via INTRAVENOUS
  Administered 2014-10-27: 100 mL/h via INTRAVENOUS

## 2014-10-26 MED ORDER — BETAMETHASONE SOD PHOS & ACET 6 (3-3) MG/ML IJ SUSP
12.0000 mg | INTRAMUSCULAR | Status: DC
  Filled 2014-10-26 (×2): qty 2

## 2014-10-26 MED ORDER — LACTATED RINGERS IV BOLUS (SEPSIS)
1000.0000 mL | Freq: Once | INTRAVENOUS | Status: AC
  Administered 2014-10-26: 1000 mL via INTRAVENOUS

## 2014-10-26 MED ORDER — PRENATAL MULTIVITAMIN CH
1.0000 | ORAL_TABLET | Freq: Every day | ORAL | Status: DC
  Administered 2014-10-27: 1 via ORAL
  Filled 2014-10-26: qty 1

## 2014-10-26 MED ORDER — ASPIRIN 81 MG PO CHEW
81.0000 mg | CHEWABLE_TABLET | Freq: Every day | ORAL | Status: DC
  Administered 2014-10-27 – 2014-10-29 (×3): 81 mg via ORAL
  Filled 2014-10-26 (×4): qty 1

## 2014-10-26 MED ORDER — INSULIN ASPART 100 UNIT/ML ~~LOC~~ SOLN
13.0000 [IU] | Freq: Once | SUBCUTANEOUS | Status: AC
  Administered 2014-10-26: 13 [IU] via SUBCUTANEOUS
  Filled 2014-10-26: qty 1

## 2014-10-26 MED ORDER — MAGNESIUM SULFATE BOLUS VIA INFUSION
4.0000 g | Freq: Once | INTRAVENOUS | Status: AC
  Administered 2014-10-26: 4 g via INTRAVENOUS
  Filled 2014-10-26: qty 500

## 2014-10-26 MED ORDER — INSULIN ASPART 100 UNIT/ML ~~LOC~~ SOLN
10.0000 [IU] | Freq: Three times a day (TID) | SUBCUTANEOUS | Status: DC
  Administered 2014-10-27 (×3): 10 [IU] via SUBCUTANEOUS

## 2014-10-26 MED ORDER — DOCUSATE SODIUM 100 MG PO CAPS
100.0000 mg | ORAL_CAPSULE | Freq: Every day | ORAL | Status: DC
  Administered 2014-10-27: 100 mg via ORAL
  Filled 2014-10-26: qty 1

## 2014-10-26 MED ORDER — CALCIUM CARBONATE ANTACID 500 MG PO CHEW: 2.0000 | CHEWABLE_TABLET | ORAL | Status: DC | PRN

## 2014-10-26 NOTE — Discharge Instructions (Signed)
Type 1 or Type 2 Diabetes Mellitus During Pregnancy Diabetes mellitus, often simply referred to as diabetes, is a long-term (chronic) disease. Type 1 diabetes occurs when the islet cells, which are in the pancreas and make the hormone insulin, are destroyed and can no longer make insulin. Type 2 diabetes occurs when the pancreas does not make enough insulin, the cells are less responsive to the insulin that is made (insulin resistance), or both. Insulin is needed to move sugars from food into the tissue cells. The tissue cells use the sugars for energy. The lack of insulin or the lack of normal response to insulin causes excess sugars to build up in the blood instead of going into the tissue cells. As a result, high blood sugar (hyperglycemia) develops.  If blood glucose levels are kept in the normal range both before and during pregnancy, women can have a healthy pregnancy. If your blood glucose levels are not well controlled, there may be risks to you, your unborn baby, and your labor and delivery. Also, there may be risks to your baby once he or she is born.  RISK FACTORS  You are predisposed to developing type 1 diabetes if someone in your family has diabetes and you are exposed to certain environmental triggers.  You have an increased chance of developing type 2 diabetes if you have a family history of diabetes and also have one or more of the following risk factors:  Being overweight.  Having an inactive lifestyle.  Having a history of consistently eating high-calorie foods. SYMPTOMS  Increased thirst (polydipsia).  Increased urination (polyuria).  Increased urination during the night (nocturia).  Weight loss. This weight loss may be rapid.  Frequent, recurring infections.  Tiredness (fatigue).  Weakness.  Vision changes, such as blurred vision.  Fruity smell to your breath.  Abdominal pain.  Nausea or vomiting. DIAGNOSIS  If you have risk factors for diabetes, you may  be screened for undiagnosed type 2 diabetes at your first prenatal visit. If you have previously given birth and you had gestational diabetes, you should be screened. The screening should be performed 6-12 weeks after the child is born and repeated every 1-3 years after the first test. Diabetes is diagnosed when blood glucose levels are increased. Your blood glucose level may be checked by one or more of the following blood tests:  A fasting blood glucose test. You will not be allowed to eat for at least 8 hours before a blood sample is taken.  A random blood glucose test. Your blood glucose is checked at any time of the day regardless of when you ate.  A hemoglobin A1c blood glucose test. A hemoglobin A1c test provides information about blood glucose control over the previous 3 months.  An oral glucose tolerance test (OGTT). Your blood glucose is measured after you have not eaten (fasted) for 1-3 hours and then after you drink a glucose-containing beverage. An OGTT is usually performed during weeks 24-28 of your pregnancy. TREATMENT   You will need to take diabetes medicine or insulin daily to keep blood glucose levels in the desired range.  You will need to match insulin dosing with exercise and healthy food choices. If you have type 1 or type 2 diabetes, your treatment goal is to maintain the following blood glucose levels during pregnancy:  Before meals (preprandial), at bedtime, and overnight: 60-99 mg/dL.  After meals (postprandial): peak of 100-129 mg/dL.  A1c: less than 7%. HOME CARE INSTRUCTIONS   Have your hemoglobin   A1c level checked twice a year.  Perform daily blood glucose monitoring as directed by your health care provider. It is common to perform frequent blood glucose monitoring.  Monitor urine ketones when you are sick and as directed by your health care provider.  Take your diabetes medicine and insulin as directed by your health care provider to maintain your  blood glucose level in the desired range.  Never run out of diabetes medicine or insulin. It is needed every day.  Adjust insulin based on your intake of carbohydrates. Carbohydrates can raise blood glucose levels but need to be included in your diet. Carbohydrates provide vitamins, minerals, and fiber, which are an essential part of a healthy diet. Carbohydrates are found in fruits, vegetables, whole grains, dairy products, legumes, and foods containing added sugars.  Eat healthy foods. Alternate 3 meals with 3 snacks.  Maintain a healthy weight gain. The usual total expected weight gain varies according to your prepregnancy body mass index (BMI).  Carry a medical alert card or wear medical alert jewelry.  Carry a 15-gram carbohydrate snack with you at all times to treat low blood sugar (hypoglycemia). Some examples of 15-gram carbohydrate snacks include:  Glucose tablets, 3 or 4.  Glucose gel, 15-gram tube.  Raisins, 2 Tbsp (24 grams).  Jelly beans, 6.  Animal crackers, 8.  Fruit juice, regular soda, or low-fat milk, 4 ounces (120 mL).  Gummy treats, 9.  Recognize hypoglycemia. Hypoglycemia during pregnancy occurs with blood glucose levels of 60 mg/dL and below. The risk for hypoglycemia increases when fasting or skipping meals, during or after intense exercise, and during sleep. Hypoglycemia symptoms can include:  Tremors or shakes.  Decreased ability to concentrate.  Sweating.  Increased heart rate.  Headache.  Dry mouth.  Hunger.  Irritability.  Anxiety.  Restless sleep.  Altered speech or coordination.  Confusion.  Treat hypoglycemia promptly. If you are alert and able to safely swallow, follow the 15:15 rule:  Take 15-20 grams of rapid-acting glucose or carbohydrate. Rapid-acting options include glucose gel, glucose tablets, or 4 ounces (120 mL) of fruit juice, regular soda, or low-fat milk.  Check your blood glucose level 15 minutes after taking the  glucose.  Take an additional 15-20 grams of glucose if the repeat blood glucose level is still 70 mg/dL or below.  Eat a meal or snack within 1 hour once blood glucose levels return to normal.  Engage in at least 30 minutes of physical activity a day or as directed by your health care provider. Ten minutes of physical activity timed 30 minutes after each meal is encouraged to control postprandial blood glucose levels.  Watch for polyuria (excess urination) and polydipsia (feeling extra thirsty), which are early signs of hyperglycemia. An early awareness of hyperglycemia allows for prompt treatment. Treat hyperglycemia as directed by your health care provider.  Adjust your insulin dosing and food intake, as needed, if you start a new exercise or sport.  Follow your sick-day plan any time you are unable to eat or drink as usual.  Avoid tobacco and alcohol use.  Keep all follow-up visits as directed by your health care provider.  Follow the advice of your health care provider regarding your prenatal and post-delivery (postpartum) appointments, meal planning, exercise, medicines, vitamins, blood tests, other medical tests, and physical activities.  Continue daily skin and foot care. Examine your skin and feet daily for cuts, bruises, redness, nail problems, bleeding, blisters, or sores. A foot exam by a health care provider should   be done annually.  Brush your teeth and gums at least twice a day and floss at least once a day. Follow up with your dentist regularly.  Schedule an eye exam during the first trimester of your pregnancy or as directed by your health care provider.  Share your diabetes management plan with your workplace or school.  Stay up-to-date with immunizations.  Learn to manage stress.  Obtain ongoing diabetes education and support as needed.  Your health care provider may recommend that you take one low-dose aspirin (81 mg) each day to help prevent high blood pressure  during your pregnancy (preeclampsia or eclampsia). You may be at risk for preeclampsia or eclampsia if:  You had preeclampsia or eclampsia during a previous pregnancy.  Your baby did not grow as expected during a previous pregnancy.  You experienced preterm birth with a previous pregnancy.  You experienced a separation of the placenta from the uterus (placental abruption) during a previous pregnancy.  You experienced the loss of your baby during a previous pregnancy.  You are pregnant with more than one baby.  You have other medical conditions, such as high blood pressure or autoimmune disease. SEEK MEDICAL CARE IF:   You are unable to eat food or drink fluids for more than 6 hours.  You have nausea and vomiting for more than 6 hours.  You have a blood glucose level of 200 mg/dL and you have ketones in your urine.  There is a change in mental status.  You develop vision problems.  You have a persistent headache.  You have upper abdominal pain or discomfort.  You have an additional serious sickness.  You have diarrhea for more than 6 hours.  You have been sick or have had a fever for 2 days and are not getting better. SEEK IMMEDIATE MEDICAL CARE IF:  You have difficulty breathing.  You no longer feel your baby moving.  You are bleeding or have discharge from your vagina.  You start having premature contractions or labor. MAKE SURE YOU:  Understand these instructions.  Will watch your condition.  Will get help right away if you are not doing well or get worse.   This information is not intended to replace advice given to you by your health care provider. Make sure you discuss any questions you have with your health care provider.   Document Released: 10/01/2011 Document Revised: 01/27/2014 Document Reviewed: 10/01/2011 Elsevier Interactive Patient Education 2016 [employer]  

## 2014-10-26 NOTE — MAU Provider Note (Signed)
History     CSN: [id number]  Arrival date and time: 10/26/14 1635   First Provider Initiated Contact with Patient 10/26/14 1731      Chief Complaint  Patient presents with  . Non-stress Test   HPI Makayla Meyer is a 24 y.o. G2P0010 at 65w0dwho presents to MAU today from MFM for fetal tachycardia and headache. The patient states that she has had headache since Tuesday. She was seen in MAU with onset of headache and had evaluation for pre-eclampsia although patient was normotensive. She states that the headache pain is only with movement of her head. She denies pain now while laying still. She has not taken anything for pain. The patient does have poorly controlled DM. She is on insulin. She states that blood glucose has been ~ 200 on average recently when checked at home. She denies abdominal pain, vaginal bleeding or LOF. She reports good fetal movement.   OB History    Gravida Para Term Preterm AB TAB SAB Ectopic Multiple Living   _0 Past Medical History  Diagnosis Date  . Diabetes mellitus without complication ([place]   . UTI (lower urinary tract infection)     Past Surgical History  Procedure Laterality Date  . No past surgeries      Family History  Problem Relation Age of Onset  . Hypertension Mother   . Asthma Mother   . Diabetes Mother   . Depression Mother   . Diabetes Sister     Social History  Substance Use Topics  . Smoking status: Never Smoker   . Smokeless tobacco: Never Used  . Alcohol Use: Yes     Comment: before pregnancy    Allergies: No Known Allergies  Prescriptions prior to admission  Medication Sig Dispense Refill Last Dose  . acetaminophen (TYLENOL) 500 MG tablet Take 1,000 mg by mouth every 6 (six) hours as needed for headache.   10/26/2014 at Unknown time  . aspirin 81 MG chewable tablet Chew 1 tablet (81 mg total) by mouth daily. 90 tablet 3 10/26/2014 at Unknown time  . cephALEXin (KEFLEX) 500 MG capsule Take 1 capsule  (500 mg total) by mouth 4 (four) times daily. 20 capsule 0 10/26/2014 at Unknown time  . insulin aspart (NOVOLOG) 100 UNIT/ML injection Inject 10 Units into the skin 3 (three) times daily before meals. 10 mL 11 10/26/2014 at Unknown time  . insulin NPH Human (HUMULIN N,NOVOLIN N) 100 UNIT/ML injection Take 8 units before breakfast and 10 units each night 10 mL 3 10/26/2014 at Unknown time  . NIFEdipine (PROCARDIA-XL/ADALAT CC) 30 MG 24 hr tablet Take 1 tablet (30 mg total) by mouth 2 (two) times daily. 60 tablet 4 10/26/2014 at Unknown time  . prenatal vitamin w/FE, FA (PRENATAL 1 + 1) 27-1 MG TABS tablet Take 1 tablet by mouth daily at 12 noon. (Patient taking differently: Take 1 tablet by mouth at bedtime. ) 30 each 10 10/25/2014 at Unknown time  . progesterone (PROMETRIUM) 200 MG capsule Place 200 mg vaginally at bedtime.    10/25/2014 at Unknown time  . ACCU-CHEK FASTCLIX LANCETS MISC 1 Units by Does not apply route 4 (four) times daily. 102 each 3 Taking  . Blood Glucose Monitoring Suppl (ACCU-CHEK NANO SMARTVIEW) W/DEVICE KIT 1 Device by Does not apply route 4 (four) times daily. 1 kit 0 Taking  . glucose blood (ACCU-CHEK SMARTVIEW) test strip 1 strip four  times daily 51 each 12 Taking    Review of Systems  Constitutional: Negative for fever and malaise/fatigue.  Gastrointestinal: Negative for abdominal pain.  Genitourinary:       Neg - vaginal bleeding, discharge, LOF  Neurological: Positive for headaches.   Physical Exam   Blood pressure 115/61, pulse 97, temperature 98.2 F (36.8 C), resp. rate 18, last menstrual period 03/09/2014.  Physical Exam  Nursing note and vitals reviewed. Constitutional: She is oriented to person, place, and time. She appears well-developed and well-nourished. No distress.  HENT:  Head: Normocephalic and atraumatic.  Cardiovascular: Normal rate.   Respiratory: Effort normal.  GI: Soft. She exhibits no distension and no mass. There is no tenderness. There is  no rebound and no guarding.  Neurological: She is alert and oriented to person, place, and time.  Skin: Skin is warm and dry. No erythema.  Psychiatric: She has a normal mood and affect.  Dilation: Fingertip Effacement (%): 90 Cervical Position: Anterior Presentation: Vertex Exam by:: J.Wenzle,PA   Results for orders placed or performed during the hospital encounter of 10/26/14 (from the past 24 hour(s))  Urinalysis, Routine w reflex microscopic (not at [facility])     Status: Abnormal   Collection Time: 10/26/14  4:40 PM  Result Value Ref Range   Color, Urine YELLOW YELLOW   APPearance CLEAR CLEAR   Specific Gravity, Urine 1.020 1.005 - 1.030   pH 6.0 5.0 - 8.0   Glucose, UA >1000 (A) NEGATIVE mg/dL   Hgb urine dipstick NEGATIVE NEGATIVE   Bilirubin Urine NEGATIVE NEGATIVE   Ketones, ur NEGATIVE NEGATIVE mg/dL   Protein, ur NEGATIVE NEGATIVE mg/dL   Urobilinogen, UA 0.2 0.0 - 1.0 mg/dL   Nitrite NEGATIVE NEGATIVE   Leukocytes, UA NEGATIVE NEGATIVE  Urine microscopic-add on     Status: None   Collection Time: 10/26/14  4:40 PM  Result Value Ref Range   Squamous Epithelial / LPF RARE RARE   WBC, UA 0-2 <3 WBC/hpf   RBC / HPF 0-2 <3 RBC/hpf   Bacteria, UA RARE RARE  Glucose, capillary     Status: Abnormal   Collection Time: 10/26/14  4:56 PM  Result Value Ref Range   Glucose-Capillary 192 (H) 65 - 99 mg/dL  CBC     Status: Abnormal   Collection Time: 10/26/14  5:10 PM  Result Value Ref Range   WBC 11.4 (H) 4.0 - 10.5 K/uL   RBC 4.02 3.87 - 5.11 MIL/uL   Hemoglobin 10.9 (L) 12.0 - 15.0 g/dL   HCT 32.8 (L) 36.0 - 46.0 %   MCV 81.6 78.0 - 100.0 fL   MCH 27.1 26.0 - 34.0 pg   MCHC 33.2 30.0 - 36.0 g/dL   RDW 13.9 11.5 - 15.5 %   Platelets 278 150 - 400 K/uL  Comprehensive metabolic panel     Status: Abnormal   Collection Time: 10/26/14  5:10 PM  Result Value Ref Range   Sodium 136 135 - 145 mmol/L   Potassium 3.9 3.5 - 5.1 mmol/L   Chloride 107 101 - 111 mmol/L   CO2 21  (L) 22 - 32 mmol/L   Glucose, Bld 198 (H) 65 - 99 mg/dL   BUN 8 6 - 20 mg/dL   Creatinine, Ser 0.40 (L) 0.44 - 1.00 mg/dL   Calcium 8.8 (L) 8.9 - 10.3 mg/dL   Total Protein 6.3 (L) 6.5 - 8.1 g/dL   Albumin 2.8 (L) 3.5 - 5.0 g/dL   AST 14 (L)  15 - 41 U/L   ALT 12 (L) 14 - 54 U/L   Alkaline Phosphatase 126 38 - 126 U/L   Total Bilirubin 0.5 0.3 - 1.2 mg/dL   GFR calc non Af Amer >60 >60 mL/min   GFR calc Af Amer >60 >60 mL/min   Anion gap 8 5 - 15   Fetal Monitoring: Baseline: initial 160 bpm, eventually settled to 150 bpm, moderate variability, + accelerations, no decelerations Contractions: moderate UI, then contractions q 3-4 minutes developed, procardia given, now mild UI  MAU Course  Procedures None  MDM UA, CBG, CBC, CMP today IV fluids Discussed patient with Dr. Nehemiah Settle. He recommends 13 units Novolog insulin prior to patient meal and Fioricet for headache Patient states that headache is much improved with Fioricet Discussed labs with Dt. Stinson. He states that patient is stable for discharge at this time. She needs to be encouraged to bring log book to clinic appointment on Monday so that we can adjust insulin if needed.  Just prior to discharge patient began to have regular contractions q 3 minutes. Cervical exam 0.5/90/-2. Discussed with Dr. Nehemiah Settle. He agrees to check cervical length with [place] and given dose of Procardia.  Patient takes Procardia XL 30 mg BID at home, will give PM dose in MAU now  Assessment and Plan  A: SIUP at 87w0dDiabetes Preterm contractions Cervical shortening, third trimester  2057 - labs pending. [facility] [place] [facility]: Dr. SNehemiah Settleon the unit to admit the patient.    HMarcille BuffyDDelight Ovens PA-C  10/26/2014, 8:57 PM

## 2014-10-26 NOTE — MAU Note (Signed)
Pt sent from MFM for fetal tachycardia.Pt reports having  Headache.

## 2014-10-26 NOTE — H&P (Signed)
Faculty Practice Antenatal History and Physical  Najla Aughenbaugh WLN:[id number] DOB: November 23, 1990 DOA: 10/26/2014  Chief Complaint: Contractions, high blood sugar  HPI: Makayla Meyer is a 24 y.o. female G2P0010 with IUP at [redacted]w[redacted]d  She has [facility] by [facility].  Her pregnancy is complicated by Class B DM, preterm labor, and polyhydramnios.  She had an ultrasound with maternal fetal medicine earlier today which showed an AFI of 28 and an estimated fetal weight of 2993 g (6 lbs. 10 oz.), which is greater than the 90th percentile. During that ultrasound, the baby's heart rate was between 180 and 200. The patient was sent to the MICU for prolonged monitoring. During this time, the fetal heart rate returned to a normal range, however the patient started having regular contractions that were mildly painful prior to being discharged. Fluids and Procardia improved with the contractions and no provoking factors, but her exam revealed a cervix that was 0.5 cm dilated and 90% effaced.  Additionally, the patient was transitioned to insulin on 9/26. The patient's blood sugars have been out of control in the 190-200 range. She did receive a dose of betamethasone earlier this week, which made her blood sugars worse.  Review of Systems:   A full 10 system review of systems was performed. Pertinent positives were in the history of present illness. Review of systems are otherwise negative  Past Medical History: Past Medical History  Diagnosis Date  . Diabetes mellitus without complication ([facility]   . UTI (lower urinary tract infection)     Past Surgical History: Past Surgical History  Procedure Laterality Date  . No past surgeries      Obstetrical History: OB History    Gravida Para Term Preterm AB TAB SAB Ectopic Multiple Living   2    1  1          Gynecological History: OB History    Gravida Para Term Preterm AB TAB SAB Ectopic Multiple Living   2    1  1          Social History: Social  History   Social History  . Marital Status: Single    Spouse Name: N/A  . Number of Children: N/A  . Years of Education: N/A   Social History Main Topics  . Smoking status: Never Smoker   . Smokeless tobacco: Never Used  . Alcohol Use: Yes     Comment: before pregnancy  . Drug Use: Yes     Comment: occasionally before pregnancy  . Sexual Activity: Yes    Birth Control/ Protection: None   Other Topics Concern  . None   Social History Narrative    Family History: Family History  Problem Relation Age of Onset  . Hypertension Mother   . Asthma Mother   . Diabetes Mother   . Depression Mother   . Diabetes Sister     Allergies: No Known Allergies  Prescriptions prior to admission  Medication Sig Dispense Refill Last Dose  . acetaminophen (TYLENOL) 500 MG tablet Take 1,000 mg by mouth every 6 (six) hours as needed for headache.   10/26/2014 at Unknown time  . aspirin 81 MG chewable tablet Chew 1 tablet (81 mg total) by mouth daily. 90 tablet 3 10/26/2014 at Unknown time  . cephALEXin (KEFLEX) 500 MG capsule Take 1 capsule (500 mg total) by mouth 4 (four) times daily. 20 capsule 0 10/26/2014 at Unknown time  . insulin aspart (NOVOLOG) 100 UNIT/ML injection Inject 10 Units into the skin  3 (three) times daily before meals. 10 mL 11 10/26/2014 at Unknown time  . insulin NPH Human (HUMULIN N,NOVOLIN N) 100 UNIT/ML injection Take 8 units before breakfast and 10 units each night 10 mL 3 10/26/2014 at Unknown time  . NIFEdipine (PROCARDIA-XL/ADALAT CC) 30 MG 24 hr tablet Take 1 tablet (30 mg total) by mouth 2 (two) times daily. 60 tablet 4 10/26/2014 at Unknown time  . prenatal vitamin w/FE, FA (PRENATAL 1 + 1) 27-1 MG TABS tablet Take 1 tablet by mouth daily at 12 noon. (Patient taking differently: Take 1 tablet by mouth at bedtime. ) 30 each 10 10/25/2014 at Unknown time  . progesterone (PROMETRIUM) 200 MG capsule Place 200 mg vaginally at bedtime.    10/25/2014 at Unknown time  . ACCU-CHEK  FASTCLIX LANCETS MISC 1 Units by Does not apply route 4 (four) times daily. 102 each 3 Taking  . Blood Glucose Monitoring Suppl (ACCU-CHEK NANO SMARTVIEW) W/DEVICE KIT 1 Device by Does not apply route 4 (four) times daily. 1 kit 0 Taking  . glucose blood (ACCU-CHEK SMARTVIEW) test strip 1 strip four times daily 51 each 12 Taking    Physical Exam: BP 115/61 mmHg  Pulse 97  Temp(Src) 98.2 F (36.8 C)  Resp 18  LMP 03/09/2014 (Exact Date)  BP 115/61 mmHg  Pulse 97  Temp(Src) 98.2 F (36.8 C)  Resp 18  LMP 03/09/2014 (Exact Date) General appearance: alert, cooperative and no distress Head: Normocephalic, without obvious abnormality, atraumatic, Pupils equal, round, reactive to light. Sclerae anicteric. Checker muscles intact. Neck: no adenopathy, no carotid bruit, no JVD, supple, symmetrical, trachea midline and thyroid not enlarged, symmetric, no tenderness/mass/nodules Lungs: clear to auscultation bilaterally - no wheezes rales or rhonchi. Heart: regular rate and rhythm, S1, S2 normal, no murmur, click, rub or gallop Abdomen: soft, non-tender; bowel sounds normal; no masses,  no organomegaly and Fundus approximate states Extremities: extremities normal, atraumatic, no cyanosis or edema and Homans sign is negative, no sign of DVT Pulses: 2+ and symmetric Skin: Skin color, texture, turgor normal. No rashes or lesions Lymph nodes: Cervical, supraclavicular, and axillary nodes normal. Neurologic: Alert and oriented X 3, normal strength and tone. Normal symmetric reflexes. Normal coordination and gait cephalic Baseline: 832 bpm, Variability: Good {> 6 bpm), Accelerations: Reactive and Decelerations: Absent Contractions: Every 3-4 minutes Dilation: Fingertip Effacement (%): 90 Exam by:: J.Wenzle,PA           Labs on Admission:  Basic Metabolic Panel:  Recent Labs Lab 10/23/14 0745 10/23/14 0926 10/26/14 1710  NA 128* 132* 136  K 6.1* 3.4* 3.9  CL 104 106 107  CO2 15* 17*  21*  GLUCOSE 209* 162* 198*  BUN 12 10 8   CREATININE 0.55 0.39* 0.40*  CALCIUM 8.5* 8.3* 8.8*   Liver Function Tests:  Recent Labs Lab 10/26/14 1710  AST 14*  ALT 12*  ALKPHOS 126  BILITOT 0.5  PROT 6.3*  ALBUMIN 2.8*   No results for input(s): LIPASE, AMYLASE in the last 168 hours. No results for input(s): AMMONIA in the last 168 hours. CBC:  Recent Labs Lab 10/23/14 0745 10/26/14 1710  WBC 12.2* 11.4*  HGB 11.5* 10.9*  HCT 33.8* 32.8*  MCV 82.0 81.6  PLT 258 278    CBG:  Recent Labs Lab 10/23/14 0543 10/26/14 1656  GLUCAP 163* 192*    Radiological Exams on Admission: [place] Mfm Fetal Bpp Wo Non Stress  10/26/2014   OBSTETRICAL ULTRASOUND: This exam was performed within a [facility] Department. The OB US report was generated in the AS system, and faxed to the ordering physician.   This report is available in the [facility]. See the AS Obstetric US report via the Image Link.  [place] Mfm Ob Follow Up  10/26/2014   OBSTETRICAL ULTRASOUND: This exam was performed within a [facility] Department. The OB US report was generated in the AS system, and faxed to the ordering physician.   This report is available in the [facility]. See the AS Obstetric US report via the Image Link.    Assessment/Plan Present on Admission:  . Threatened preterm labor . Diabetes mellitus affecting pregnancy, antepartum . Cervical shortening affecting pregnancy, antepartum  #1 threatened preterm labor #2 diabetes mellitus affecting pregnancy #3 tibial shortening affecting pregnancy #4 intrauterine pregnancy at 61 weeks   Admit for preterm labor evaluation and blood sugar control  Evaluation of the patient in the MAU included a CBC, complete metabolic panel, blood glucose ultrasound for cervical length which was ordered under my direction and reviewed by me.  Start magnesium 4 g load with 2 g per hour  As patient has had a recent rescue dose of betamethasone,  will not repeat  Continuous toco with NST twice daily  CBG fasting and 2 hours postprandial  Continue home insulin   Truett Mainland, DO 10/26/2014 10:02 PM Faculty Practice Attending Physician [facility] Attending Phone #: [phone]

## 2014-10-26 NOTE — ED Notes (Signed)
Pt states she has had head ache for last 3 days.  Thinks it may be related to medicine she received in MAU on Monday (brethine?). States +FM, BP wnl.

## 2014-10-27 DIAGNOSIS — O403XX0 Polyhydramnios, third trimester, not applicable or unspecified: Secondary | ICD-10-CM

## 2014-10-27 LAB — GLUCOSE, CAPILLARY
GLUCOSE-CAPILLARY: 130 mg/dL — AB (ref 65–99)
Glucose-Capillary: 180 mg/dL — ABNORMAL HIGH (ref 65–99)
Glucose-Capillary: 224 mg/dL — ABNORMAL HIGH (ref 65–99)

## 2014-10-27 MED ORDER — DOCUSATE SODIUM 100 MG PO CAPS
100.0000 mg | ORAL_CAPSULE | Freq: Every day | ORAL | Status: DC
  Administered 2014-10-28 – 2014-10-29 (×2): 100 mg via ORAL
  Filled 2014-10-27 (×2): qty 1

## 2014-10-27 MED ORDER — MAGNESIUM SULFATE 50 % IJ SOLN
2.0000 g/h | INTRAVENOUS | Status: DC
  Filled 2014-10-27: qty 80

## 2014-10-27 MED ORDER — CALCIUM CARBONATE ANTACID 500 MG PO CHEW: 2.0000 | CHEWABLE_TABLET | ORAL | Status: DC | PRN

## 2014-10-27 MED ORDER — BETAMETHASONE SOD PHOS & ACET 6 (3-3) MG/ML IJ SUSP
12.0000 mg | INTRAMUSCULAR | Status: DC
  Administered 2014-10-27: 12 mg via INTRAMUSCULAR

## 2014-10-27 MED ORDER — TERBUTALINE SULFATE 1 MG/ML IJ SOLN
0.2500 mg | Freq: Once | INTRAMUSCULAR | Status: AC
  Administered 2014-10-27: 1 mg via SUBCUTANEOUS

## 2014-10-27 MED ORDER — INSULIN ASPART 100 UNIT/ML ~~LOC~~ SOLN
0.0000 [IU] | Freq: Four times a day (QID) | SUBCUTANEOUS | Status: DC
  Administered 2014-10-27: 4 [IU] via SUBCUTANEOUS
  Administered 2014-10-27: 6 [IU] via SUBCUTANEOUS
  Administered 2014-10-28: 3 [IU] via SUBCUTANEOUS
  Administered 2014-10-28: 2 [IU] via SUBCUTANEOUS
  Administered 2014-10-28: 4 [IU] via SUBCUTANEOUS
  Administered 2014-10-29: 6 [IU] via SUBCUTANEOUS

## 2014-10-27 MED ORDER — LACTATED RINGERS IV SOLN
2.0000 g/h | INTRAVENOUS | Status: DC
Start: 2014-10-27 — End: 2014-10-28
  Administered 2014-10-27: 2 g/h via INTRAVENOUS

## 2014-10-27 MED ORDER — BUTALBITAL-APAP-CAFFEINE 50-325-40 MG PO TABS
1.0000 | ORAL_TABLET | ORAL | Status: DC | PRN
  Administered 2014-10-27 (×2): 1 via ORAL
  Filled 2014-10-27 (×2): qty 1

## 2014-10-27 MED ORDER — ACETAMINOPHEN 325 MG PO TABS
650.0000 mg | ORAL_TABLET | ORAL | Status: DC | PRN
Start: 2014-10-27 — End: 2014-10-29

## 2014-10-27 MED ORDER — INSULIN ASPART 100 UNIT/ML ~~LOC~~ SOLN
0.0000 [IU] | Freq: Four times a day (QID) | SUBCUTANEOUS | Status: DC
  Administered 2014-10-27: 4 [IU] via SUBCUTANEOUS

## 2014-10-27 MED ORDER — BETAMETHASONE SOD PHOS & ACET 6 (3-3) MG/ML IJ SUSP
12.0000 mg | Freq: Once | INTRAMUSCULAR | Status: DC
  Filled 2014-10-27: qty 2

## 2014-10-27 MED ORDER — PRENATAL MULTIVITAMIN CH
1.0000 | ORAL_TABLET | Freq: Every day | ORAL | Status: DC
Start: 2014-10-28 — End: 2014-10-29
  Administered 2014-10-28 – 2014-10-29 (×2): 1 via ORAL
  Filled 2014-10-27 (×2): qty 1

## 2014-10-27 MED ORDER — INSULIN NPH (HUMAN) (ISOPHANE) 100 UNIT/ML ~~LOC~~ SUSP
12.0000 [IU] | Freq: Two times a day (BID) | SUBCUTANEOUS | Status: DC
  Administered 2014-10-27: 12 [IU] via SUBCUTANEOUS

## 2014-10-27 MED ORDER — ZOLPIDEM TARTRATE 5 MG PO TABS: 5.0000 mg | ORAL_TABLET | Freq: Every evening | ORAL | Status: DC | PRN

## 2014-10-27 MED ORDER — TERBUTALINE SULFATE 1 MG/ML IJ SOLN
INTRAMUSCULAR | Status: AC
  Filled 2014-10-27: qty 1

## 2014-10-27 MED ORDER — BUTALBITAL-APAP-CAFFEINE 50-325-40 MG PO TABS: 1.0000 | ORAL_TABLET | Freq: Once | ORAL | Status: DC

## 2014-10-27 MED ORDER — LACTATED RINGERS IV SOLN
INTRAVENOUS | Status: DC
  Administered 2014-10-28 (×2): via INTRAVENOUS

## 2014-10-27 NOTE — Progress Notes (Addendum)
Inpatient Diabetes Program Recommendations Inpatient Diabetes Program Recommendations  Diabetes Treatment Program Recommendations  ADA Standards of Care 2016 Diabetes in Pregnancy Target Glucose Ranges:  Fasting: 60 - 90 mg/dL Preprandial: 60 - 161 mg/dL 1 hr postprandial: Less than /dL (from first bite of meal) 2 hr postprandial: Less than 120 mg/dL (from first bite of meal)   Results for SHAELA, BOER (MRN [id number]) as of 10/27/2014 09:37  Ref. Range 10/26/2014 16:56 10/26/2014 23:11  Glucose-Capillary Latest Ref Range: 65-99 mg/dL 811 (H) 914 (H)   Review of Glycemic Control  Diabetes history: GDM Outpatient Diabetes medications: NPH 8 units QAM, NPH 10 units QHS, Novolog 10 units TID with meals Current orders for Inpatient glycemic control: NPH 8 units QAM, Novolog 10 units TID with meals  Inpatient Diabetes Program Recommendations: Insulin - Basal: Noted there is only an order for morning NPH and no evening NPH. Please consider ordering NPH 12 BID. Correction (SSI): Please consider using the Diabetic Pregnant Patient order set to order Novolog 0-16 units QID (fasting and 2 hour post prandial).  Note: Spoke with Dr. Macon Large regarding recommendations and she has placed new orders.  Thanks, Orlando Penner, RN, MSN, CCRN, CDE Diabetes Coordinator Inpatient Diabetes Program [phone] (Team Pager from 8am to 5pm) [phone] ([facility] office) [phone] [facility] office) [phone] [facility] office)

## 2014-10-27 NOTE — Plan of Care (Signed)
Problem: Consults Goal: Birthing Suites Patient Information Press F2 to bring up selections list   Pt < [redacted] weeks EGA     

## 2014-10-27 NOTE — Progress Notes (Signed)
FACULTY PRACTICE ANTEPARTUM COMPREHENSIVE PROGRESS NOTE  Makayla Meyer is a 24 y.o. G2P0010 at 12w1dwho is admitted for short cervix, threatened PTL and for glycemic control with Class B DM.  Estimated Date of Delivery: 12/14/14 Fetal presentation is cephalic on 178/9/38ultrasound.  Length of Stay:  1 Days. Admitted 10/26/2014  Subjective: No complaints Patient reports good fetal movement.  She reports rare uterine contractions, no bleeding and no loss of fluid per vagina.  Vitals:  Blood pressure 119/77, pulse 99, temperature 97.6 F (36.4 C), temperature source Oral, resp. rate 18, last menstrual period 03/09/2014. Physical Examination: CONSTITUTIONAL: Well-developed, well-nourished female in no acute distress.  HENT:  Normocephalic, atraumatic, External right and left ear normal. Oropharynx is clear and moist EYES: Conjunctivae and EOM are normal. Pupils are equal, round, and reactive to light. No scleral icterus.  NECK: Normal range of motion, supple, no masses SKIN: Skin is warm and dry. No rash noted. Not diaphoretic. No erythema. No pallor. NLoraine Alert and oriented to person, place, and time. Normal reflexes, muscle tone coordination. No cranial nerve deficit noted. PSYCHIATRIC: Normal mood and affect. Normal behavior. Normal judgment and thought content. CARDIOVASCULAR: Normal heart rate noted, regular rhythm RESPIRATORY: Effort and breath sounds normal, no problems with respiration noted MUSCULOSKELETAL: Normal range of motion. No edema and no tenderness. 2+ distal pulses. ABDOMEN: Soft, nontender, nondistended, gravid. CERVIX: Dilation: Fingertip Effacement (%): 90 Cervical Position: Anterior Presentation: Vertex Exam by:: J.Wenzle,PA (done on admission)  Fetal monitoring: FHR: 135 bpm, Variability: moderate, Accelerations: Present, Decelerations: Absent  Uterine activity: 0-2 contractions per hour  Results for orders placed or performed during the hospital encounter of  10/26/14 (from the past 48 hour(s))  Urinalysis, [facility]     Status: Abnormal   Collection Time: 10/26/14  4:40 PM  Result Value Ref Range   Color, Urine YELLOW YELLOW   APPearance CLEAR CLEAR   Specific Gravity, Urine 1.020 1.005 - 1.030   pH 6.0 5.0 - 8.0   Glucose, UA >1000 (A) NEGATIVE mg/dL   Hgb urine dipstick NEGATIVE NEGATIVE   Bilirubin Urine NEGATIVE NEGATIVE   Ketones, ur NEGATIVE NEGATIVE mg/dL   Protein, ur NEGATIVE NEGATIVE mg/dL   Urobilinogen, UA 0.2 0.0 - 1.0 mg/dL   Nitrite NEGATIVE NEGATIVE   Leukocytes, UA NEGATIVE NEGATIVE  Urine microscopic-add on     Status: None   Collection Time: 10/26/14  4:40 PM  Result Value Ref Range   Squamous Epithelial / LPF RARE RARE   WBC, UA 0-2 <3 WBC/hpf   RBC / HPF 0-2 <3 RBC/hpf   Bacteria, UA RARE RARE  Glucose, capillary     Status: Abnormal   Collection Time: 10/26/14  4:56 PM  Result Value Ref Range   Glucose-Capillary 192 (H) 65 - 99 mg/dL  CBC     Status: Abnormal   Collection Time: 10/26/14  5:10 PM  Result Value Ref Range   WBC 11.4 (H) 4.0 - 10.5 K/uL   RBC 4.02 3.87 - 5.11 MIL/uL   Hemoglobin 10.9 (L) 12.0 - 15.0 g/dL   HCT 32.8 (L) 36.0 - 46.0 %   MCV 81.6 78.0 - 100.0 fL   MCH 27.1 26.0 - 34.0 pg   MCHC 33.2 30.0 - 36.0 g/dL   RDW 13.9 11.5 - 15.5 %   Platelets 278 150 - 400 K/uL  Comprehensive metabolic panel     Status: Abnormal   Collection Time: 10/26/14  5:10 PM  Result Value  Ref Range   Sodium 136 135 - 145 mmol/L   Potassium 3.9 3.5 - 5.1 mmol/L   Chloride 107 101 - 111 mmol/L   CO2 21 (L) 22 - 32 mmol/L   Glucose, Bld 198 (H) 65 - 99 mg/dL   BUN 8 6 - 20 mg/dL   Creatinine, Ser 0.40 (L) 0.44 - 1.00 mg/dL   Calcium 8.8 (L) 8.9 - 10.3 mg/dL   Total Protein 6.3 (L) 6.5 - 8.1 g/dL   Albumin 2.8 (L) 3.5 - 5.0 g/dL   AST 14 (L) 15 - 41 U/L   ALT 12 (L) 14 - 54 U/L   Alkaline Phosphatase 126 38 - 126 U/L   Total Bilirubin 0.5 0.3 - 1.2 mg/dL   GFR calc non Af  Amer >60 >60 mL/min   GFR calc Af Amer >60 >60 mL/min    Comment: (NOTE) The eGFR has been calculated using the CKD EPI equation. This calculation has not been validated in all clinical situations. eGFR's persistently <60 mL/min signify possible Chronic Kidney Disease.    Anion gap 8 5 - 15  Fetal fibronectin     Status: None   Collection Time: 10/26/14  7:30 PM  Result Value Ref Range   Fetal Fibronectin NEGATIVE NEGATIVE  Type and screen     Status: None   Collection Time: 10/26/14 10:30 PM  Result Value Ref Range   ABO/RH(D) A POS    Antibody Screen NEG    Sample Expiration 10/29/2014   Glucose, capillary     Status: Abnormal   Collection Time: 10/26/14 11:11 PM  Result Value Ref Range   Glucose-Capillary 127 (H) 65 - 99 mg/dL  Glucose, capillary     Status: Abnormal   Collection Time: 10/27/14  8:13 AM  Result Value Ref Range   Glucose-Capillary 130 (H) 65 - 99 mg/dL  Glucose, capillary     Status: Abnormal   Collection Time: 10/27/14 10:20 AM  Result Value Ref Range   Glucose-Capillary 180 (H) 65 - 99 mg/dL    10/26/2014   MFM OBSTETRICAL ULTRASOUND: EFW 2993g (2-67)/>12%, AC >45%, cephalic, AFI 80.99 cm, cervix 1.1 cm length  Current scheduled medications . aspirin  81 mg Oral Daily  . betamethasone acetate-betamethasone sodium phosphate  12 mg Intramuscular Q24H  . cephALEXin  500 mg Oral QID  . docusate sodium  100 mg Oral Daily  . insulin aspart  0-16 Units Subcutaneous QID  . insulin aspart  10 Units Subcutaneous TID AC  . insulin NPH Human  12 Units Subcutaneous BID AC & HS  . NIFEdipine  30 mg Oral BID  . prenatal multivitamin  1 tablet Oral Q1200  . progesterone  200 mg Vaginal QHS    I have reviewed the patient's current medications.  ASSESSMENT: Principal Problem:   Cervical shortening affecting pregnancy, antepartum Active Problems:   Diabetes mellitus affecting pregnancy, antepartum   Supervision of high risk pregnancy, antepartum   Group B  Streptococcus carrier, +RV culture, currently pregnant   Polyhydramnios in third trimester, antepartum   Threatened preterm labor   PLAN: Continue magnesium sulfate for now, consider transition to Procardia tomorrow morning Already received betamethasone on 10/3 and 10/4 (2nd course) Appreciate recommendations by Mechele Dawley, RN, CDE (Diabetes Coordinator); insulin regimen changes made. Will continue to monitor CBGs and adjust regimen as needed. Continue antenatal testing for polydramnios. Continue routine antenatal care.   Osborne Oman, MD Attending [place], [facility]

## 2014-10-28 DIAGNOSIS — O26873 Cervical shortening, third trimester: Secondary | ICD-10-CM | POA: Insufficient documentation

## 2014-10-28 LAB — GLUCOSE, CAPILLARY
GLUCOSE-CAPILLARY: 179 mg/dL — AB (ref 65–99)
Glucose-Capillary: 189 mg/dL — ABNORMAL HIGH (ref 65–99)
Glucose-Capillary: 176 mg/dL — ABNORMAL HIGH (ref 65–99)
Glucose-Capillary: 129 mg/dL — ABNORMAL HIGH (ref 65–99)

## 2014-10-28 MED ORDER — INSULIN NPH (HUMAN) (ISOPHANE) 100 UNIT/ML ~~LOC~~ SUSP
15.0000 [IU] | Freq: Two times a day (BID) | SUBCUTANEOUS | Status: DC
  Administered 2014-10-28 (×2): 15 [IU] via SUBCUTANEOUS

## 2014-10-28 MED ORDER — INSULIN ASPART 100 UNIT/ML ~~LOC~~ SOLN
12.0000 [IU] | Freq: Three times a day (TID) | SUBCUTANEOUS | Status: DC
  Administered 2014-10-28 (×3): 12 [IU] via SUBCUTANEOUS

## 2014-10-28 NOTE — Progress Notes (Signed)
Diabetes Treatment Program Recommendations  ADA Standards of Care 2016 Diabetes in Pregnancy Target Glucose Ranges:  Fasting: 60 - 90 mg/dL Preprandial: 60 - 096 mg/dL 1 hr postprandial: Less than /dL (from first bite of meal) 2 hr postprandial: Less than 120 mg/dL (from first bit of meal) Results for [name], [name] (MRN [id number]) as of 10/28/2014 09:17  Ref. Range 10/27/2014 08:13 10/27/2014 10:20 10/27/2014 14:57 10/27/2014 22:01 10/28/2014 06:15  Glucose-Capillary Latest Ref Range: 65-99 mg/dL 914 (H) 782 (H) 956 (H) 224 (H) 176 (H)    Noted patient received Betamethasone 12 mg on 10/7 at 18:03 which is contributing to hyperglycemia. According to Dr. Mont Dutton note on 10/27/14, patient also received Betamethasone on 10/3 and 10/4. Fasting glucose 176 mg/dl this morning. Note that NPH was increased from 12 to 15 units BID and Novolog meal coverage was increased from 10 to 12 units TID with meals. Agree with insulin changes made today. Will continue to follow along and make further recommendations if needed.  Thanks, Orlando Penner, RN, MSN, CCRN, CDE Diabetes Coordinator Inpatient Diabetes Program [phone] (Team Pager from 8am to 5pm) [phone] ([facility] office) [phone] [facility] office) [phone] [facility] office)

## 2014-10-28 NOTE — Progress Notes (Signed)
Patient ID: Makayla Meyer, female   DOB: [birth year], 24 y.o.   MRN: [id number] FACULTY PRACTICE ANTEPARTUM(COMPREHENSIVE) NOTE  Makayla Meyer is a 24 y.o. G2P0010 at [redacted]w[redacted]d  who is admitted for shortened cervix, contractions, and glycemic control.   Length of Stay:  2  Days  Subjective: No complaints Patient reports the fetal movement as active. Patient reports uterine contraction  activity as rare. Patient reports  vaginal bleeding as none. Patient describes fluid per vagina as None.  Vitals:  Blood pressure 114/64, pulse 87, temperature 98.5 F (36.9 C), temperature source Oral, resp. rate 18, height  (1.549 m), weight 134 lb 6.4 oz (60.963 kg), last menstrual period 03/09/2014. Physical Examination:  General appearance - alert, well appearing, and in no distress Abdomen - soft, nontender, nondistended, no masses or organomegaly Pelvic - cervix fingertip, thin Extremities - no edema, redness or tenderness in the calves or thighs, Homan's sign negative bilaterally  Fetal Monitoring:  Baseline: 130 bpm, Variability: Good {> 6 bpm), Accelerations: Reactive and Decelerations: Absent  Labs:  Results for orders placed or performed during the hospital encounter of 10/26/14 (from the past 24 hour(s))  Glucose, capillary   Collection Time: 10/27/14  8:13 AM  Result Value Ref Range   Glucose-Capillary 130 (H) 65 - 99 mg/dL  Glucose, capillary   Collection Time: 10/27/14 10:20 AM  Result Value Ref Range   Glucose-Capillary 180 (H) 65 - 99 mg/dL  Glucose, capillary   Collection Time: 10/27/14  2:57 PM  Result Value Ref Range   Glucose-Capillary 189 (H) 65 - 99 mg/dL  Glucose, capillary   Collection Time: 10/27/14 10:01 PM  Result Value Ref Range   Glucose-Capillary 224 (H) 65 - 99 mg/dL  Glucose, capillary   Collection Time: 10/28/14  6:15 AM  Result Value Ref Range   Glucose-Capillary 176 (H) 65 - 99 mg/dL    Imaging Studies:    Cervix 0/5 cm (see mfm scan)  Medications:   Scheduled . aspirin  81 mg Oral Daily  . cephALEXin  500 mg Oral QID  . docusate sodium  100 mg Oral Daily  . insulin aspart  0-16 Units Subcutaneous QID  . insulin aspart  12 Units Subcutaneous TID AC  . insulin NPH Human  15 Units Subcutaneous BID AC & HS  . NIFEdipine  30 mg Oral BID  . prenatal multivitamin  1 tablet Oral Q1200  . progesterone  200 mg Vaginal QHS   I have reviewed the patient's current medications.  ASSESSMENT: Patient Active Problem List   Diagnosis Date Noted  . Threatened preterm labor 10/26/2014  . Polyhydramnios in third trimester, antepartum 10/22/2014  . Group B Streptococcus carrier, +RV culture, currently pregnant 09/24/2014  . Cervical shortening affecting pregnancy, antepartum   . Diabetes mellitus affecting pregnancy, antepartum 05/29/2014  . Supervision of high risk pregnancy, antepartum 05/29/2014    PLAN: #preterm labor--no cervical change.  Stop magnesium and recheck cervix.  Toco prn Continue procardia and prometrium. #DM--manage cbgs.  Novalog increased to 12 before each meal.  NPH increased to 15 untis bid   Coden Franchi H. 10/28/2014,7:28 AM

## 2014-10-29 DIAGNOSIS — O4700 False labor before 37 completed weeks of gestation, unspecified trimester: Secondary | ICD-10-CM | POA: Insufficient documentation

## 2014-10-29 DIAGNOSIS — O479 False labor, unspecified: Secondary | ICD-10-CM | POA: Insufficient documentation

## 2014-10-29 LAB — GLUCOSE, CAPILLARY
GLUCOSE-CAPILLARY: 212 mg/dL — AB (ref 65–99)
Glucose-Capillary: 101 mg/dL — ABNORMAL HIGH (ref 65–99)
Glucose-Capillary: 131 mg/dL — ABNORMAL HIGH (ref 65–99)

## 2014-10-29 MED ORDER — INSULIN ASPART 100 UNIT/ML ~~LOC~~ SOLN: 14.0000 [IU] | Freq: Three times a day (TID) | SUBCUTANEOUS | Status: DC

## 2014-10-29 MED ORDER — NIFEDIPINE ER 30 MG PO TB24: 30.0000 mg | ORAL_TABLET | Freq: Two times a day (BID) | ORAL | Status: DC

## 2014-10-29 MED ORDER — INSULIN NPH (HUMAN) (ISOPHANE) 100 UNIT/ML ~~LOC~~ SUSP
18.0000 [IU] | Freq: Two times a day (BID) | SUBCUTANEOUS | Status: DC
  Administered 2014-10-29: 18 [IU] via SUBCUTANEOUS

## 2014-10-29 MED ORDER — INSULIN NPH (HUMAN) (ISOPHANE) 100 UNIT/ML ~~LOC~~ SUSP: 18.0000 [IU] | Freq: Two times a day (BID) | SUBCUTANEOUS | Status: DC

## 2014-10-29 MED ORDER — INSULIN ASPART 100 UNIT/ML ~~LOC~~ SOLN
14.0000 [IU] | Freq: Three times a day (TID) | SUBCUTANEOUS | Status: DC
  Administered 2014-10-29 (×2): 14 [IU] via SUBCUTANEOUS

## 2014-10-29 MED ORDER — PROGESTERONE MICRONIZED 200 MG PO CAPS: 200.0000 mg | ORAL_CAPSULE | Freq: Every day | ORAL | Status: DC

## 2014-10-29 NOTE — Discharge Summary (Signed)
Physician Discharge Summary  Patient ID: Cache Decoursey MRN: [id number] DOB/AGE: 25-10-1990 25 y.o.  Admit date: 10/26/2014 Discharge date: 10/29/2014  Admission Diagnoses:  24 yo female G2P0010 at 65 weeks with shortened cervix  Discharge Diagnoses:  Principal Problem:   Cervical shortening affecting pregnancy, antepartum Active Problems:   Diabetes mellitus affecting pregnancy, antepartum   Supervision of high risk pregnancy, antepartum   Group B Streptococcus carrier, +RV culture, currently pregnant   Polyhydramnios in third trimester, antepartum   Threatened preterm labor   Cervical shortening affecting pregnancy in third trimester, antepartum   Preterm contractions   Discharged Condition: good  Hospital Course: 24 yo female G2P0010 at 33 weeks and found to have a shortened cervix (0.5cm).  Pt admitted and finished her second course of steroids (not to be repeated), tocolysis with magnesium sulfate, and glycemic control.  Pt did well.  No cervical change.  Pt also finished antibiotics for recently diagnoses asymptomatic bacteruria.  Will have diabetic educator call her tomorrow for CBG review.  Patient will continue procardia and progesterone.  RTC 1 week.  Consults: None  Significant Diagnostic Studies: labs: cbg and urine culture  Treatments: betamethasone (finished second course, tocolysis, glucose stabilization  Discharge Exam: Blood pressure 97/39, pulse 98, temperature 98.5 F (36.9 C), temperature source Oral, resp. rate 18, height 5' 1" (1.549 m), weight 134 lb 6.4 oz (60.963 kg), last menstrual period 03/09/2014. General appearance: alert, cooperative and no distress GI: gravid, soft, nontender Pelvic: finger tip / 90% / ballotable Extremities: extremities normal, atraumatic, no cyanosis or edema  NST reactive on discharge  Disposition: 01-Home or Self Care     Medication List    STOP taking these medications        cephALEXin 500 MG capsule  Commonly known  as:  KEFLEX      TAKE these medications        ACCU-CHEK FASTCLIX LANCETS Misc  1 Units by Does not apply route 4 (four) times daily.     ACCU-CHEK NANO SMARTVIEW W/DEVICE Kit  1 Device by Does not apply route 4 (four) times daily.     acetaminophen 500 MG tablet  Commonly known as:  TYLENOL  Take 1,000 mg by mouth every 6 (six) hours as needed for headache.     aspirin 81 MG chewable tablet  Chew 1 tablet (81 mg total) by mouth daily.     glucose blood test strip  Commonly known as:  ACCU-CHEK SMARTVIEW  1 strip four times daily     insulin aspart 100 UNIT/ML injection  Commonly known as:  NOVOLOG  Inject 14 Units into the skin 3 (three) times daily before meals.     insulin NPH Human 100 UNIT/ML injection  Commonly known as:  HUMULIN N,NOVOLIN N  Inject 0.18 mLs (18 Units total) into the skin 2 (two) times daily at 8 am and 10 pm.     NIFEdipine 30 MG 24 hr tablet  Commonly known as:  PROCARDIA-XL/ADALAT CC  Take 1 tablet (30 mg total) by mouth 2 (two) times daily.     prenatal vitamin w/FE, FA 27-1 MG Tabs tablet  Take 1 tablet by mouth daily at 12 noon.     progesterone 200 MG capsule  Commonly known as:  PROMETRIUM  Place 200 mg vaginally at bedtime.           Follow-up Information    Follow up with [facility].   Specialty:  Obstetrics and Gynecology   Why:  As scheduled on 10/30/14   Contact information:   [place] [phone]      Follow up with [facility] In 1 week.   Contact information:   [place] [id number] [phone]      Signed: Guss Bunde 10/29/2014, 2:48 PM

## 2014-10-29 NOTE — Progress Notes (Signed)
Patient discharged home with significant other at side.  Patient education completed. Patient will return to  Hospital as needed. Patient to follow up with physician in one week and tomorrow with diabetic educator on phone.

## 2014-10-29 NOTE — Progress Notes (Signed)
Patient ID: Makayla Meyer, female   DOB: 12-Aug-1990, 24 y.o.   MRN: [id number] FACULTY PRACTICE ANTEPARTUM(COMPREHENSIVE) NOTE  Makayla Meyer is a 24 y.o. G2P0010 at [redacted]w[redacted]d  who is admitted for shortened cervix, contractions, and glycemic control.   Length of Stay:  3  Days  Subjective: No complaints Patient reports the fetal movement as active. Patient reports uterine contraction  activity as rare. Patient reports  vaginal bleeding as none. Patient describes fluid per vagina as None.  Vitals:  Blood pressure 106/51, pulse 92, temperature 98.5 F (36.9 C), temperature source Oral, resp. rate 18, height  (1.549 m), weight 134 lb 6.4 oz (60.963 kg), last menstrual period 03/09/2014. Physical Examination:  General appearance - alert, well appearing, and in no distress Abdomen - soft, nontender, nondistended, no masses or organomegaly Pelvic - cervix fingertip, thin (on previous exam) Extremities - no edema, redness or tenderness in the calves or thighs, Homan's sign negative bilaterally  Fetal Monitoring:  Baseline: 130 bpm, Variability: Good {> 6 bpm), Accelerations: Reactive and Decelerations: Absent Toco: no contractions  Labs:  Results for orders placed or performed during the hospital encounter of 10/26/14 (from the past 24 hour(s))  Glucose, capillary   Collection Time: 10/28/14 12:19 PM  Result Value Ref Range   Glucose-Capillary 179 (H) 65 - 99 mg/dL   Comment 1 Notify RN   Glucose, capillary   Collection Time: 10/28/14  6:06 PM  Result Value Ref Range   Glucose-Capillary 129 (H) 65 - 99 mg/dL   Comment 1 Notify RN   Glucose, capillary   Collection Time: 10/29/14 12:14 AM  Result Value Ref Range   Glucose-Capillary 212 (H) 65 - 99 mg/dL    Imaging Studies:    Cervix 0/5 cm (see mfm scan)  Medications:  Scheduled . aspirin  81 mg Oral Daily  . cephALEXin  500 mg Oral QID  . docusate sodium  100 mg Oral Daily  . insulin aspart  0-16 Units Subcutaneous QID  . insulin  aspart  14 Units Subcutaneous TID AC  . insulin NPH Human  18 Units Subcutaneous BID AC & HS  . NIFEdipine  30 mg Oral BID  . prenatal multivitamin  1 tablet Oral Q1200  . progesterone  200 mg Vaginal QHS   I have reviewed the patient's current medications.  ASSESSMENT: Patient Active Problem List   Diagnosis Date Noted  . Preterm contractions   . 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 Threatened preterm labor 10/26/2014  . Polyhydramnios in third trimester, antepartum 10/22/2014  . Group B Streptococcus carrier, +RV culture, currently pregnant 09/24/2014  . Cervical shortening affecting pregnancy, antepartum   . Diabetes mellitus affecting pregnancy, antepartum 05/29/2014  . Supervision of high risk pregnancy, antepartum 05/29/2014    PLAN: - Continue tocolysis with procardia  - CBGs remain elevated. Will increase NPH to 18 BID and Novolog 14 before each meal - Continue current antepartum care     Serenity Batley 10/29/2014,6:46 AM

## 2014-10-30 ENCOUNTER — Telehealth: Payer: Self-pay | Admitting: *Deleted

## 2014-10-30 ENCOUNTER — Encounter: Payer: Medicaid Other | Admitting: Obstetrics & Gynecology

## 2014-10-30 ENCOUNTER — Other Ambulatory Visit: Payer: Medicaid Other

## 2014-10-30 LAB — CULTURE, OB URINE
Culture: NO GROWTH
Special Requests: NORMAL

## 2014-10-30 NOTE — Telephone Encounter (Signed)
Patient discharged from [facility] the afternoon of 10/29/14. Readings as followed: 10/29/14      glucose was >4hpp with a reading of /dl. 10/30/14   FBS on  ( ) /dl took 16X NPH and 09U Novolog as directed 10/30/14   pp ( :00)  Reading /dl Discussion with Dr. Macon Large increase NPH to 22units Breakfast and bedtime. Increase Novolog to 16 units with each meal. Encouraged Makayla Meyer to get back on regular schedule with meals, testing and medication and try to follow food guidelines best possible. Next clinic visit 11/02/14.

## 2014-10-31 ENCOUNTER — Telehealth: Payer: Self-pay | Admitting: *Deleted

## 2014-10-31 NOTE — Telephone Encounter (Signed)
Called pt and reminded her of appt scheduled on 10/13 @ 4pm. I asked her to arrive @ 3:30 since she will need to see the doctor as well to review her blood sugar values.  Pt advised that she MUST bring her blood sugar log book as this is extremely important for best prenatal care. Pt's mother also was on the phone and expressed concerns about the amount of swelling Makayla Meyer has in her legs, ankles and feet. She is very concerned about pre-eclampsia. I advised that swelling is normal in pregnancy and is by itself is not an indicator of pre-eclampsia. We will evaluate further at scheduled appt on 10/13. Meanwhile, pt should drink 6-8 glasses of water daily, have frequent rest periods while lying on her side and come to the hospital if she develops a severe headache or has any type of visual disturbances such as blurry vision, dizziness or seeing spots. Pt and mother voiced understanding of all information and instructions given.

## 2014-11-02 ENCOUNTER — Ambulatory Visit (INDEPENDENT_AMBULATORY_CARE_PROVIDER_SITE_OTHER): Payer: Medicaid Other | Admitting: Obstetrics & Gynecology

## 2014-11-02 VITALS — BP 125/74 | HR 95 | Wt 150.4 lb

## 2014-11-02 DIAGNOSIS — O24919 Unspecified diabetes mellitus in pregnancy, unspecified trimester: Secondary | ICD-10-CM | POA: Diagnosis present

## 2014-11-02 DIAGNOSIS — O403XX1 Polyhydramnios, third trimester, fetus 1: Secondary | ICD-10-CM | POA: Diagnosis not present

## 2014-11-02 MED ORDER — INSULIN ASPART 100 UNIT/ML ~~LOC~~ SOLN: 18.0000 [IU] | Freq: Three times a day (TID) | SUBCUTANEOUS | Status: DC

## 2014-11-02 MED ORDER — INSULIN NPH (HUMAN) (ISOPHANE) 100 UNIT/ML ~~LOC~~ SUSP: 22.0000 [IU] | Freq: Two times a day (BID) | SUBCUTANEOUS | Status: DC

## 2014-11-02 NOTE — Progress Notes (Signed)
Subjective:  Makayla Meyer is a 24 y.o. G2P0010 at 2450w0d being seen today for ongoing prenatal care.  Patient reports leg swelling.   .   .  . Denies leaking of fluid.   The following portions of the patient's history were reviewed and updated as appropriate: allergies, current medications, past family history, past medical history, past social history, past surgical history and problem list. Problem list updated.  Objective:  There were no vitals filed for this visit.  Fetal Status:           General:  Alert, oriented and cooperative. Patient is in no acute distress.  Skin: Skin is warm and dry. No rash noted.   Cardiovascular: Normal heart rate noted  Respiratory: Normal respiratory effort, no problems with respiration noted  Abdomen: Soft, gravid, appropriate for gestational age.       Pelvic:       Cervical exam deferred        Extremities: Normal range of motion.   2+ pitting edema below knees  Mental Status: Normal mood and affect. Normal behavior. Normal judgment and thought content.   Urinalysis:      Assessment and Plan:  Pregnancy: G2P0010 at 2150w0d  1. Diabetes mellitus affecting pregnancy, antepartum PP 170-185, fasting 2/3 nl range.  Increase Novolog with meals to 18 units - Amniotic fluid index with NST  2. Polyhydramnios in third trimester, antepartum, fetus 1 NST reaqctive - Amniotic fluid index with NST 3. Edema, could be due to procardia, will discontinue Preterm labor symptoms and general obstetric precautions including but not limited to vaginal bleeding, contractions, leaking of fluid and fetal movement were reviewed in detail with the patient. Please refer to After Visit Summary for other counseling recommendations.  Return in about 5 days (around 11/07/2014).   Adam PhenixJames G Arnold, MD

## 2014-11-02 NOTE — Patient Instructions (Signed)

## 2014-11-06 ENCOUNTER — Encounter (HOSPITAL_COMMUNITY): Payer: Self-pay

## 2014-11-06 ENCOUNTER — Inpatient Hospital Stay (HOSPITAL_COMMUNITY)
Admission: AD | Admit: 2014-11-06 | Discharge: 2014-11-06 | Disposition: A | Discharge status: Home / Self Care | Payer: Medicaid Other | Source: Ambulatory Visit | Attending: Obstetrics & Gynecology | Admitting: Obstetrics & Gynecology

## 2014-11-06 DIAGNOSIS — O24113 Pre-existing diabetes mellitus, type 2, in pregnancy, third trimester: Secondary | ICD-10-CM | POA: Insufficient documentation

## 2014-11-06 DIAGNOSIS — O24919 Unspecified diabetes mellitus in pregnancy, unspecified trimester: Secondary | ICD-10-CM

## 2014-11-06 DIAGNOSIS — E119 Type 2 diabetes mellitus without complications: Secondary | ICD-10-CM | POA: Insufficient documentation

## 2014-11-06 DIAGNOSIS — Z3A34 34 weeks gestation of pregnancy: Secondary | ICD-10-CM | POA: Insufficient documentation

## 2014-11-06 DIAGNOSIS — O99820 Streptococcus B carrier state complicating pregnancy: Secondary | ICD-10-CM | POA: Diagnosis not present

## 2014-11-06 LAB — URINE MICROSCOPIC-ADD ON

## 2014-11-06 LAB — URINALYSIS, ROUTINE W REFLEX MICROSCOPIC
Bilirubin Urine: NEGATIVE
Glucose, UA: 100 mg/dL — AB
Hgb urine dipstick: NEGATIVE
Ketones, ur: NEGATIVE mg/dL
Nitrite: NEGATIVE
Protein, ur: NEGATIVE mg/dL
Specific Gravity, Urine: 1.01 (ref 1.005–1.030)
Urobilinogen, UA: 0.2 mg/dL (ref 0.0–1.0)
pH: 6.5 (ref 5.0–8.0)

## 2014-11-06 MED ORDER — OXYCODONE-ACETAMINOPHEN 5-325 MG PO TABS
2.0000 | ORAL_TABLET | Freq: Once | ORAL | Status: AC
  Administered 2014-11-06: 2 via ORAL
  Filled 2014-11-06: qty 2

## 2014-11-06 NOTE — Discharge Instructions (Signed)
Braxton Hicks Contractions °Contractions of the uterus can occur throughout pregnancy. Contractions are not always a sign that you are in labor.  °WHAT ARE BRAXTON HICKS CONTRACTIONS?  °Contractions that occur before labor are called Braxton Hicks contractions, or false labor. Toward the end of pregnancy (32-34 weeks), these contractions can develop more often and may become more forceful. This is not true labor because these contractions do not result in opening (dilatation) and thinning of the cervix. They are sometimes difficult to tell apart from true labor because these contractions can be forceful and people have different pain tolerances. You should not feel embarrassed if you go to the hospital with false labor. Sometimes, the only way to tell if you are in true labor is for your health care provider to look for changes in the cervix. °If there are no prenatal problems or other health problems associated with the pregnancy, it is completely safe to be sent home with false labor and await the onset of true labor. °HOW CAN YOU TELL THE DIFFERENCE BETWEEN TRUE AND FALSE LABOR? °False Labor °· The contractions of false labor are usually shorter and not as hard as those of true labor.   °· The contractions are usually irregular.   °· The contractions are often felt in the front of the lower abdomen and in the groin.   °· The contractions may go away when you walk around or change positions while lying down.   °· The contractions get weaker and are shorter lasting as time goes on.   °· The contractions do not usually become progressively stronger, regular, and closer together as with true labor.   °True Labor °1. Contractions in true labor last 30-70 seconds, become very regular, usually become more intense, and increase in frequency.   °2. The contractions do not go away with walking.   °3. The discomfort is usually felt in the top of the uterus and spreads to the lower abdomen and low back.   °4. True labor can  be determined by your health care provider with an exam. This will show that the cervix is dilating and getting thinner.   °WHAT TO REMEMBER °· Keep up with your usual exercises and follow other instructions given by your health care provider.   °· Take medicines as directed by your health care provider.   °· Keep your regular prenatal appointments.   °· Eat and drink lightly if you think you are going into labor.   °· If Braxton Hicks contractions are making you uncomfortable:   °· Change your position from lying down or resting to walking, or from walking to resting.   °· Sit and rest in a tub of warm water.   °· Drink 2-3 glasses of water. Dehydration may cause these contractions.   °· Do slow and deep breathing several times an hour.   °WHEN SHOULD I SEEK IMMEDIATE MEDICAL CARE? °Seek immediate medical care if: °· Your contractions become stronger, more regular, and closer together.   °· You have fluid leaking or gushing from your vagina.   °· You have a fever.   °· You pass blood-tinged mucus.   °· You have vaginal bleeding.   °· You have continuous abdominal pain.   °· You have low back pain that you never had before.   °· You feel your baby's head pushing down and causing pelvic pressure.   °· Your baby is not moving as much as it used to.   °  °This information is not intended to replace advice given to you by your health care provider. Make sure you discuss any questions you have with your health care   provider. °  °Document Released: 01/06/2005 Document Revised: 01/11/2013 Document Reviewed: 10/18/2012 °Elsevier Interactive Patient Education ©2016 [employer] ° °Fetal Movement Counts °Patient Name: __________________________________________________ Patient Due Date: ____________________ °Performing a fetal movement count is highly recommended in high-risk pregnancies, but it is good for every pregnant woman to do. Your health care provider may ask you to start counting fetal movements at 28 weeks of the  pregnancy. Fetal movements often increase: °· After eating a full meal. °· After physical activity. °· After eating or drinking something sweet or cold. °· At rest. °Pay attention to when you feel the baby is most active. This will help you notice a pattern of your baby's sleep and wake cycles and what factors contribute to an increase in fetal movement. It is important to perform a fetal movement count at the same time each day when your baby is normally most active.  °HOW TO COUNT FETAL MOVEMENTS °5. Find a quiet and comfortable area to sit or lie down on your left side. Lying on your left side provides the best blood and oxygen circulation to your baby. °6. Write down the day and time on a sheet of paper or in a journal. °7. Start counting kicks, flutters, swishes, rolls, or jabs in a 2-hour period. You should feel at least 10 movements within 2 hours. °8. If you do not feel 10 movements in 2 hours, wait 2-3 hours and count again. Look for a change in the pattern or not enough counts in 2 hours. °SEEK MEDICAL CARE IF: °· You feel less than 10 counts in 2 hours, tried twice. °· There is no movement in over an hour. °· The pattern is changing or taking longer each day to reach 10 counts in 2 hours. °· You feel the baby is not moving as he or she usually does. °Date: ____________ Movements: ____________ Start time: ____________ Finish time: ____________  °Date: ____________ Movements: ____________ Start time: ____________ Finish time: ____________ °Date: ____________ Movements: ____________ Start time: ____________ Finish time: ____________ °Date: ____________ Movements: ____________ Start time: ____________ Finish time: ____________ °Date: ____________ Movements: ____________ Start time: ____________ Finish time: ____________ °Date: ____________ Movements: ____________ Start time: ____________ Finish time: ____________ °Date: ____________ Movements: ____________ Start time: ____________ Finish time:  ____________ °Date: ____________ Movements: ____________ Start time: ____________ Finish time: ____________  °Date: ____________ Movements: ____________ Start time: ____________ Finish time: ____________ °Date: ____________ Movements: ____________ Start time: ____________ Finish time: ____________ °Date: ____________ Movements: ____________ Start time: ____________ Finish time: ____________ °Date: ____________ Movements: ____________ Start time: ____________ Finish time: ____________ °Date: ____________ Movements: ____________ Start time: ____________ Finish time: ____________ °Date: ____________ Movements: ____________ Start time: ____________ Finish time: ____________ °Date: ____________ Movements: ____________ Start time: ____________ Finish time: ____________  °Date: ____________ Movements: ____________ Start time: ____________ Finish time: ____________ °Date: ____________ Movements: ____________ Start time: ____________ Finish time: ____________ °Date: ____________ Movements: ____________ Start time: ____________ Finish time: ____________ °Date: ____________ Movements: ____________ Start time: ____________ Finish time: ____________ °Date: ____________ Movements: ____________ Start time: ____________ Finish time: ____________ °Date: ____________ Movements: ____________ Start time: ____________ Finish time: ____________ °Date: ____________ Movements: ____________ Start time: ____________ Finish time: ____________  °Date: ____________ Movements: ____________ Start time: ____________ Finish time: ____________ °Date: ____________ Movements: ____________ Start time: ____________ Finish time: ____________ °Date: ____________ Movements: ____________ Start time: ____________ Finish time: ____________ °Date: ____________ Movements: ____________ Start time: ____________ Finish time: ____________ °Date: ____________ Movements: ____________ Start time: ____________ Finish time: ____________ °Date: ____________ Movements:  ____________ Start time: ____________ Finish   time: ____________ °Date: ____________ Movements: ____________ Start time: ____________ Finish time: ____________  °Date: ____________ Movements: ____________ Start time: ____________ Finish time: ____________ °Date: ____________ Movements: ____________ Start time: ____________ Finish time: ____________ °Date: ____________ Movements: ____________ Start time: ____________ Finish time: ____________ °Date: ____________ Movements: ____________ Start time: ____________ Finish time: ____________ °Date: ____________ Movements: ____________ Start time: ____________ Finish time: ____________ °Date: ____________ Movements: ____________ Start time: ____________ Finish time: ____________ °Date: ____________ Movements: ____________ Start time: ____________ Finish time: ____________  °Date: ____________ Movements: ____________ Start time: ____________ Finish time: ____________ °Date: ____________ Movements: ____________ Start time: ____________ Finish time: ____________ °Date: ____________ Movements: ____________ Start time: ____________ Finish time: ____________ °Date: ____________ Movements: ____________ Start time: ____________ Finish time: ____________ °Date: ____________ Movements: ____________ Start time: ____________ Finish time: ____________ °Date: ____________ Movements: ____________ Start time: ____________ Finish time: ____________ °Date: ____________ Movements: ____________ Start time: ____________ Finish time: ____________  °Date: ____________ Movements: ____________ Start time: ____________ Finish time: ____________ °Date: ____________ Movements: ____________ Start time: ____________ Finish time: ____________ °Date: ____________ Movements: ____________ Start time: ____________ Finish time: ____________ °Date: ____________ Movements: ____________ Start time: ____________ Finish time: ____________ °Date: ____________ Movements: ____________ Start time: ____________ Finish  time: ____________ °Date: ____________ Movements: ____________ Start time: ____________ Finish time: ____________ °Date: ____________ Movements: ____________ Start time: ____________ Finish time: ____________  °Date: ____________ Movements: ____________ Start time: ____________ Finish time: ____________ °Date: ____________ Movements: ____________ Start time: ____________ Finish time: ____________ °Date: ____________ Movements: ____________ Start time: ____________ Finish time: ____________ °Date: ____________ Movements: ____________ Start time: ____________ Finish time: ____________ °Date: ____________ Movements: ____________ Start time: ____________ Finish time: ____________ °Date: ____________ Movements: ____________ Start time: ____________ Finish time: ____________ °  °This information is not intended to replace advice given to you by your health care provider. Make sure you discuss any questions you have with your health care provider. °  °Document Released: 02/05/2006 Document Revised: 01/27/2014 Document Reviewed: 11/03/2011 °Elsevier Interactive Patient Education ©2016 [employer] ° °

## 2014-11-06 NOTE — MAU Provider Note (Signed)
Chief Complaint:  No chief complaint on file.   None     HPI: Makayla Meyer is a 24 y.o. G2P0010 at 71w4dwho presents to maternity admissions reporting contractions and back pain x 2 hours. She also reports swelling of both legs/feet that is painful.  The pain is constant and sore.  She reports the swelling decreases, but does not go completely away when she sleeps at night.   She reports good fetal movement, denies LOF, vaginal bleeding, vaginal itching/burning, urinary symptoms, h/a, dizziness, n/v, or fever/chills.    Abdominal Cramping This is a new problem. The current episode started today. The onset quality is gradual. The problem occurs intermittently. The most recent episode lasted 2 hours. The problem has been waxing and waning. The pain is located in the generalized abdominal region. The pain is moderate. The quality of the pain is cramping. The abdominal pain radiates to the back. Pertinent negatives include no constipation, diarrhea, dysuria, fever, frequency, headaches, nausea or vomiting. Nothing aggravates the pain. The pain is relieved by nothing. She has tried nothing for the symptoms.    Past Medical History: Past Medical History  Diagnosis Date  . Diabetes mellitus without complication (HCC)   . UTI (lower urinary tract infection)     Past obstetric history: OB History  Gravida Para Term Preterm AB SAB TAB Ectopic Multiple Living  # Outcome Date GA Lbr Len/2nd Weight Sex Delivery Anes PTL Lv  2 Current           1 SAB 2013 [redacted]w[redacted]d             Past Surgical History: Past Surgical History  Procedure Laterality Date  . No past surgeries      Family History: Family History  Problem Relation Age of Onset  . Hypertension Mother   . Asthma Mother   . Diabetes Mother   . Depression Mother   . Diabetes Sister     Social History: Social History  Substance Use Topics  . Smoking status: Never Smoker   . Smokeless tobacco: Never Used  . Alcohol Use:  No     Comment: before pregnancy    Allergies: No Known Allergies  Meds:  No prescriptions prior to admission    ROS:  Review of Systems  Constitutional: Negative for fever, chills and fatigue.  HENT: Negative for sinus pressure.   Eyes: Negative for photophobia.  Respiratory: Negative for shortness of breath.   Cardiovascular: Negative for chest pain.  Gastrointestinal: Negative for nausea, vomiting, diarrhea and constipation.  Genitourinary: Negative for dysuria, frequency, flank pain, vaginal bleeding, vaginal discharge, difficulty urinating, vaginal pain and pelvic pain.  Musculoskeletal: Negative for neck pain.  Neurological: Negative for dizziness, weakness and headaches.  Psychiatric/Behavioral: Negative.      I have reviewed patient's Past Medical Hx, Surgical Hx, Family Hx, Social Hx, medications and allergies.   Physical Exam   Patient Vitals for the past 24 hrs:  BP Temp Temp src Pulse Resp SpO2  11/06/14 0328 123/85 mmHg - - 85 18 -  11/06/14 0146 120/73 mmHg 98.2 F (36.8 C) Oral 76 18 100 %   Constitutional: Well-developed, well-nourished female in no acute distress.  Cardiovascular: normal rate Respiratory: normal effort GI: Abd soft, non-tender, gravid appropriate for gestational age.  MS: Extremities nontender, no edema, normal ROM Neurologic: Alert and oriented x 4.  GU: Neg CVAT.   Dilation: Fingertip Effacement (%): 90 Cervical  Position: Anterior Station: -1 Presentation: Vertex Exam by:: D Simpson RN   Cervix unchanged in 1 hour  FHT:  Baseline 125 , moderate variability, accelerations present, no decelerations Contractions: q 2-15 mins, irregular, mild to palpation   Labs: Results for orders placed or performed during the hospital encounter of 11/06/14 (from the past 24 hour(s))  Urinalysis, Routine w reflex microscopic ([facility])     Status: Abnormal   Collection Time: 11/06/14  2:17 AM  Result Value Ref Range   Color, Urine  YELLOW YELLOW   APPearance CLEAR CLEAR   Specific Gravity, Urine 1.010 1.005 - 1.030   pH 6.5 5.0 - 8.0   Glucose, UA 100 (A) NEGATIVE mg/dL   Hgb urine dipstick NEGATIVE NEGATIVE   Bilirubin Urine NEGATIVE NEGATIVE   Ketones, ur NEGATIVE NEGATIVE mg/dL   Protein, ur NEGATIVE NEGATIVE mg/dL   Urobilinogen, UA 0.2 0.0 - 1.0 mg/dL   Nitrite NEGATIVE NEGATIVE   Leukocytes, UA SMALL (A) NEGATIVE  Urine microscopic-add on     Status: Abnormal   Collection Time: 11/06/14  2:17 AM  Result Value Ref Range   Squamous Epithelial / LPF RARE RARE   WBC, UA 3-6 <3 WBC/hpf   RBC / HPF 0-2 <3 RBC/hpf   Bacteria, UA FEW (A) RARE   --/--/A POS (10/06 2230)   MAU Course/MDM: I have ordered labs and reviewed results.  Reviewed FHR tracing.  With irregular contractions on monitor and cervix unchanged in >1 hour, preterm labor unlikely.  Urine sent for culture but no clear evidence of infection. Treatments in MAU included Percocet 5/325 x 2 tabs, with pt report of decreased pain prior to discharge.  Pt stable at time of discharge.  Assessment: 1. Group B Streptococcus carrier, +RV culture, currently pregnant   2. Diabetes mellitus affecting pregnancy, antepartum     Plan: Discharge home Preterm labor precautions and fetal kick counts Keep scheduled appt in [facility] Return to MAU as needed for emergencies     Follow-up Information    [facility]   Why:  as scheduled. Return to Maternity Admissions as needed.   Contact information:   [address] [place] [id number] [phone]       Sharen CounterLisa Leftwich-Kirby Certified Nurse-Midwife 11/06/2014 7:40 AM

## 2014-11-06 NOTE — MAU Note (Signed)
Pt reports she has been hurting in her back and radiates to her abd.

## 2014-11-07 ENCOUNTER — Other Ambulatory Visit: Payer: Self-pay | Admitting: *Deleted

## 2014-11-07 ENCOUNTER — Ambulatory Visit (INDEPENDENT_AMBULATORY_CARE_PROVIDER_SITE_OTHER): Payer: Medicaid Other | Admitting: Family

## 2014-11-07 VITALS — BP 110/79 | HR 83 | Temp 97.5°F | Wt 147.0 lb

## 2014-11-07 DIAGNOSIS — R8271 Bacteriuria: Secondary | ICD-10-CM

## 2014-11-07 LAB — CULTURE, OB URINE

## 2014-11-07 LAB — POCT URINALYSIS DIP (DEVICE)
Bilirubin Urine: NEGATIVE
Glucose, UA: NEGATIVE mg/dL
Hgb urine dipstick: NEGATIVE
KETONES UR: NEGATIVE mg/dL
Nitrite: NEGATIVE
PH: 7 (ref 5.0–8.0)
PROTEIN: NEGATIVE mg/dL
SPECIFIC GRAVITY, URINE: 1.015 (ref 1.005–1.030)
Urobilinogen, UA: 0.2 mg/dL (ref 0.0–1.0)

## 2014-11-07 MED ORDER — INSULIN ASPART 100 UNIT/ML ~~LOC~~ SOLN
SUBCUTANEOUS | Status: DC
Start: 2014-11-07 — End: 2014-11-20

## 2014-11-07 NOTE — Progress Notes (Signed)
Has lost 3 lbs, states she feels it is fluid loss because she can see her knees now and swelling in feet/legs only goes up to calfes now instead of past knee. Moderate leukocytes noted on urinalysis.

## 2014-11-07 NOTE — Progress Notes (Signed)
Pt seen @ MAU yesterday and had reactive FHR w/spontaneous negative CST per EFM.  NST cancelled for today.

## 2014-11-07 NOTE — Progress Notes (Signed)
Subjective:  Makayla Meyer is a 24 y.o. G2P0010 at 6729w5d being seen today for ongoing prenatal care.  Patient reports occasional contractions.  Contractions: Not present.  Vag. Bleeding: None. Movement: Present. Denies leaking of fluid.   The following portions of the patient's history were reviewed and updated as appropriate: allergies, current medications, past family history, past medical history, past social history, past surgical history and problem list. Problem list updated.  Objective:   Filed Vitals:   11/07/14 0925  BP: 110/79  Pulse: 83  Temp: 97.5 F (36.4 C)  Weight: 147 lb (66.679 kg)    Fetal Status: Fetal Heart Rate (bpm): 134   Movement: Present     General:  Alert, oriented and cooperative. Patient is in no acute distress.  Skin: Skin is warm and dry. No rash noted.   Cardiovascular: Normal heart rate noted  Respiratory: Normal respiratory effort, no problems with respiration noted  Abdomen: Soft, gravid, appropriate for gestational age. Pain/Pressure: Present     Pelvic: Vag. Bleeding: None Vag D/C Character: Mucous   Cervical exam deferred        Extremities: Normal range of motion.  Edema: Mild pitting, slight indentation  Mental Status: Normal mood and affect. Normal behavior. Normal judgment and thought content.   Urinalysis: Urine Protein: Negative Urine Glucose: Negative  Fasting PPB PPL PPD  72-96, 3/7 abnl 59-165, 2/7 abnl 55-198, 4/7 abnl 68-153, 4/7 abnl    Assessment and Plan:  Pregnancy: G2P0010 at 3329w5d  1. Bacteria in urine - Culture, OB Urine  2. Polyhydramnios - Continue NST - Follow-up ultrasound for 2 wks (growth)  3.  Diabetes - Increase short acting to 20 for lunch/dinner  Preterm labor symptoms and general obstetric precautions including but not limited to vaginal bleeding, contractions, leaking of fluid and fetal movement were reviewed in detail with the patient. Please refer to After Visit Summary for other counseling  recommendations.  Return in about 2 days (around 11/09/2014) for 2x/wk as scheduled.   Eino FarberWalidah Kennith GainN Karim, CNM

## 2014-11-07 NOTE — Addendum Note (Signed)
Addended by: Jill SideAY, Mia Milan L on: 11/07/2014 10:33 AM   Modules accepted: Orders

## 2014-11-09 ENCOUNTER — Ambulatory Visit (INDEPENDENT_AMBULATORY_CARE_PROVIDER_SITE_OTHER): Payer: Medicaid Other | Admitting: *Deleted

## 2014-11-09 ENCOUNTER — Ambulatory Visit (HOSPITAL_COMMUNITY)
Admission: RE | Admit: 2014-11-09 | Discharge: 2014-11-09 | Disposition: A | Discharge status: Home / Self Care | Payer: Medicaid Other | Source: Ambulatory Visit | Attending: Obstetrics & Gynecology | Admitting: Obstetrics & Gynecology

## 2014-11-09 ENCOUNTER — Other Ambulatory Visit (HOSPITAL_COMMUNITY): Payer: Self-pay | Admitting: Obstetrics and Gynecology

## 2014-11-09 VITALS — BP 116/72 | HR 103

## 2014-11-09 VITALS — BP 126/86 | HR 84 | Wt 147.2 lb

## 2014-11-09 DIAGNOSIS — O24313 Unspecified pre-existing diabetes mellitus in pregnancy, third trimester: Secondary | ICD-10-CM | POA: Insufficient documentation

## 2014-11-09 DIAGNOSIS — O403XX1 Polyhydramnios, third trimester, fetus 1: Secondary | ICD-10-CM | POA: Diagnosis not present

## 2014-11-09 DIAGNOSIS — O403XX0 Polyhydramnios, third trimester, not applicable or unspecified: Secondary | ICD-10-CM

## 2014-11-09 DIAGNOSIS — O26873 Cervical shortening, third trimester: Secondary | ICD-10-CM | POA: Insufficient documentation

## 2014-11-09 DIAGNOSIS — O352XX0 Maternal care for (suspected) hereditary disease in fetus, not applicable or unspecified: Secondary | ICD-10-CM | POA: Insufficient documentation

## 2014-11-09 DIAGNOSIS — O24919 Unspecified diabetes mellitus in pregnancy, unspecified trimester: Secondary | ICD-10-CM

## 2014-11-09 DIAGNOSIS — Z3A35 35 weeks gestation of pregnancy: Secondary | ICD-10-CM | POA: Insufficient documentation

## 2014-11-09 LAB — CULTURE, OB URINE

## 2014-11-13 ENCOUNTER — Telehealth: Payer: Self-pay | Admitting: *Deleted

## 2014-11-13 ENCOUNTER — Encounter: Payer: Self-pay | Admitting: Obstetrics & Gynecology

## 2014-11-13 ENCOUNTER — Ambulatory Visit (INDEPENDENT_AMBULATORY_CARE_PROVIDER_SITE_OTHER): Payer: Medicaid Other | Admitting: Obstetrics & Gynecology

## 2014-11-13 VITALS — BP 117/79 | HR 95 | Wt 147.2 lb

## 2014-11-13 DIAGNOSIS — O24919 Unspecified diabetes mellitus in pregnancy, unspecified trimester: Secondary | ICD-10-CM | POA: Diagnosis present

## 2014-11-13 DIAGNOSIS — O403XX0 Polyhydramnios, third trimester, not applicable or unspecified: Secondary | ICD-10-CM | POA: Diagnosis not present

## 2014-11-13 DIAGNOSIS — O403XX1 Polyhydramnios, third trimester, fetus 1: Secondary | ICD-10-CM

## 2014-11-13 LAB — POCT URINALYSIS DIP (DEVICE)
Bilirubin Urine: NEGATIVE
GLUCOSE, UA: 100 mg/dL — AB
HGB URINE DIPSTICK: NEGATIVE
Ketones, ur: NEGATIVE mg/dL
NITRITE: NEGATIVE
Protein, ur: NEGATIVE mg/dL
Specific Gravity, Urine: 1.015 (ref 1.005–1.030)
UROBILINOGEN UA: 0.2 mg/dL (ref 0.0–1.0)
pH: 7 (ref 5.0–8.0)

## 2014-11-13 MED ORDER — INSULIN NPH (HUMAN) (ISOPHANE) 100 UNIT/ML ~~LOC~~ SUSP: SUBCUTANEOUS | Status: DC

## 2014-11-13 NOTE — Progress Notes (Signed)
Subjective:  Makayla SavoyDeja Meyer is a 24 y.o. G2P0010 at 3741w4d being seen today for ongoing prenatal care.  Patient reports worsening of fasting CBGs.  Pt did not bring CBG log but reports fasting CBGs to be 90-110.  Will increase NPH in evening from 22 units to 25 units.  Pt reports pp values as nml.  Contractions: Irregular.  Vag. Bleeding: None. Movement: Present. Denies leaking of fluid.   The following portions of the patient's history were reviewed and updated as appropriate: allergies, current medications, past family history, past medical history, past social history, past surgical history and problem list. Problem list updated.  Objective:   Filed Vitals:   11/13/14 0759  BP: 117/79  Pulse: 95  Weight: 147 lb 3.2 oz (66.769 kg)    Fetal Status: Fetal Heart Rate (bpm): RNST   Movement: Present     General:  Alert, oriented and cooperative. Patient is in no acute distress.  Skin: Skin is warm and dry. No rash noted.   Cardiovascular: Normal heart rate noted  Respiratory: Normal respiratory effort, no problems with respiration noted  Abdomen: Soft, gravid, appropriate for gestational age. Pain/Pressure: Present     Pelvic: Vag. Bleeding: None     Cervical exam deferred        Extremities: Normal range of motion.  Edema: Mild pitting, slight indentation  Mental Status: Normal mood and affect. Normal behavior. Normal judgment and thought content.   Urinalysis:    see above  Past Medical History  Diagnosis Date  . Diabetes mellitus without complication (HCC)   . UTI (lower urinary tract infection)      Assessment and Plan:  Pregnancy: G2P0010 at 3041w4d  1. Diabetes mellitus affecting pregnancy, antepartum--WORSENING -Increase NPH as above - Fetal nonstress test  2. Polyhydramnios in third trimester, antepartum, fetus 1 -delivery at 39 weeks - Fetal nonstress test  Preterm labor symptoms and general obstetric precautions including but not limited to vaginal bleeding,  contractions, leaking of fluid and fetal movement were reviewed in detail with the patient. Please refer to After Visit Summary for other counseling recommendations.  Return in about 3 days (around 11/16/2014) for 2x/wk as scheduled.   Lesly DukesKelly H Ayanni Tun, MD

## 2014-11-13 NOTE — Progress Notes (Signed)
US for growth scheduled 11/3

## 2014-11-14 ENCOUNTER — Encounter: Payer: Self-pay | Admitting: *Deleted

## 2014-11-16 ENCOUNTER — Ambulatory Visit (INDEPENDENT_AMBULATORY_CARE_PROVIDER_SITE_OTHER): Payer: Medicaid Other | Admitting: *Deleted

## 2014-11-16 VITALS — BP 131/74 | HR 81

## 2014-11-16 DIAGNOSIS — O403XX1 Polyhydramnios, third trimester, fetus 1: Secondary | ICD-10-CM

## 2014-11-16 DIAGNOSIS — O24913 Unspecified diabetes mellitus in pregnancy, third trimester: Secondary | ICD-10-CM

## 2014-11-16 DIAGNOSIS — O24919 Unspecified diabetes mellitus in pregnancy, unspecified trimester: Secondary | ICD-10-CM

## 2014-11-16 NOTE — Progress Notes (Signed)
NST reviewed and reactive.  

## 2014-11-17 ENCOUNTER — Inpatient Hospital Stay (HOSPITAL_COMMUNITY)
Admission: AD | Admit: 2014-11-17 | Discharge: 2014-11-20 | DRG: 775 | Disposition: A | Discharge status: Home / Self Care | Payer: Medicaid Other | Source: Ambulatory Visit | Attending: Obstetrics and Gynecology | Admitting: Obstetrics and Gynecology

## 2014-11-17 DIAGNOSIS — O4703 False labor before 37 completed weeks of gestation, third trimester: Secondary | ICD-10-CM

## 2014-11-17 DIAGNOSIS — O26879 Cervical shortening, unspecified trimester: Secondary | ICD-10-CM | POA: Diagnosis present

## 2014-11-17 DIAGNOSIS — O99824 Streptococcus B carrier state complicating childbirth: Secondary | ICD-10-CM | POA: Diagnosis present

## 2014-11-17 DIAGNOSIS — O42913 Preterm premature rupture of membranes, unspecified as to length of time between rupture and onset of labor, third trimester: Secondary | ICD-10-CM | POA: Diagnosis present

## 2014-11-17 DIAGNOSIS — O24919 Unspecified diabetes mellitus in pregnancy, unspecified trimester: Secondary | ICD-10-CM

## 2014-11-17 DIAGNOSIS — O403XX0 Polyhydramnios, third trimester, not applicable or unspecified: Secondary | ICD-10-CM | POA: Diagnosis present

## 2014-11-17 DIAGNOSIS — O0993 Supervision of high risk pregnancy, unspecified, third trimester: Secondary | ICD-10-CM

## 2014-11-17 DIAGNOSIS — Z3A36 36 weeks gestation of pregnancy: Secondary | ICD-10-CM

## 2014-11-17 DIAGNOSIS — O24425 Gestational diabetes mellitus in childbirth, controlled by oral hypoglycemic drugs: Secondary | ICD-10-CM | POA: Diagnosis present

## 2014-11-17 DIAGNOSIS — O0990 Supervision of high risk pregnancy, unspecified, unspecified trimester: Secondary | ICD-10-CM

## 2014-11-17 DIAGNOSIS — O26873 Cervical shortening, third trimester: Secondary | ICD-10-CM | POA: Diagnosis present

## 2014-11-17 DIAGNOSIS — O4290 Premature rupture of membranes, unspecified as to length of time between rupture and onset of labor, unspecified weeks of gestation: Secondary | ICD-10-CM | POA: Diagnosis present

## 2014-11-17 DIAGNOSIS — O99820 Streptococcus B carrier state complicating pregnancy: Secondary | ICD-10-CM

## 2014-11-17 DIAGNOSIS — O42013 Preterm premature rupture of membranes, onset of labor within 24 hours of rupture, third trimester: Secondary | ICD-10-CM | POA: Diagnosis present

## 2014-11-17 DIAGNOSIS — O3663X0 Maternal care for excessive fetal growth, third trimester, not applicable or unspecified: Secondary | ICD-10-CM | POA: Diagnosis present

## 2014-11-17 NOTE — MAU Note (Signed)
Srom at 2200. Contractions since 2230. Type II diabetic on Insulin. Has been admitted for PTL several times.

## 2014-11-18 ENCOUNTER — Inpatient Hospital Stay (HOSPITAL_COMMUNITY): Payer: Medicaid Other | Admitting: Anesthesiology

## 2014-11-18 ENCOUNTER — Encounter (HOSPITAL_COMMUNITY): Payer: Self-pay | Admitting: Student

## 2014-11-18 DIAGNOSIS — O99824 Streptococcus B carrier state complicating childbirth: Secondary | ICD-10-CM | POA: Diagnosis not present

## 2014-11-18 DIAGNOSIS — O26873 Cervical shortening, third trimester: Secondary | ICD-10-CM | POA: Diagnosis present

## 2014-11-18 DIAGNOSIS — O26893 Other specified pregnancy related conditions, third trimester: Secondary | ICD-10-CM | POA: Diagnosis present

## 2014-11-18 DIAGNOSIS — O42013 Preterm premature rupture of membranes, onset of labor within 24 hours of rupture, third trimester: Secondary | ICD-10-CM | POA: Diagnosis present

## 2014-11-18 DIAGNOSIS — O24425 Gestational diabetes mellitus in childbirth, controlled by oral hypoglycemic drugs: Secondary | ICD-10-CM | POA: Diagnosis present

## 2014-11-18 DIAGNOSIS — O4290 Premature rupture of membranes, unspecified as to length of time between rupture and onset of labor, unspecified weeks of gestation: Secondary | ICD-10-CM | POA: Diagnosis present

## 2014-11-18 DIAGNOSIS — O42913 Preterm premature rupture of membranes, unspecified as to length of time between rupture and onset of labor, third trimester: Secondary | ICD-10-CM | POA: Diagnosis not present

## 2014-11-18 DIAGNOSIS — E119 Type 2 diabetes mellitus without complications: Secondary | ICD-10-CM

## 2014-11-18 DIAGNOSIS — Z3A36 36 weeks gestation of pregnancy: Secondary | ICD-10-CM | POA: Diagnosis not present

## 2014-11-18 DIAGNOSIS — O2432 Unspecified pre-existing diabetes mellitus in childbirth: Secondary | ICD-10-CM | POA: Diagnosis not present

## 2014-11-18 DIAGNOSIS — Z794 Long term (current) use of insulin: Secondary | ICD-10-CM

## 2014-11-18 DIAGNOSIS — O3663X0 Maternal care for excessive fetal growth, third trimester, not applicable or unspecified: Secondary | ICD-10-CM | POA: Diagnosis present

## 2014-11-18 LAB — POCT FERN TEST: POCT FERN TEST: POSITIVE

## 2014-11-18 LAB — GLUCOSE, CAPILLARY
GLUCOSE-CAPILLARY: 147 mg/dL — AB (ref 65–99)
GLUCOSE-CAPILLARY: 81 mg/dL (ref 65–99)
Glucose-Capillary: 117 mg/dL — ABNORMAL HIGH (ref 65–99)
Glucose-Capillary: 89 mg/dL (ref 65–99)
Glucose-Capillary: 65 mg/dL (ref 65–99)
Glucose-Capillary: 131 mg/dL — ABNORMAL HIGH (ref 65–99)
Glucose-Capillary: 183 mg/dL — ABNORMAL HIGH (ref 65–99)

## 2014-11-18 LAB — CBC
HEMATOCRIT: 31.9 % — AB (ref 36.0–46.0)
HEMOGLOBIN: 10.8 g/dL — AB (ref 12.0–15.0)
MCH: 27.3 pg (ref 26.0–34.0)
MCHC: 33.9 g/dL (ref 30.0–36.0)
MCV: 80.6 fL (ref 78.0–100.0)
Platelets: 257 10*3/uL (ref 150–400)
RBC: 3.96 MIL/uL (ref 3.87–5.11)
RDW: 14.4 % (ref 11.5–15.5)
WBC: 13 10*3/uL — ABNORMAL HIGH (ref 4.0–10.5)

## 2014-11-18 LAB — TYPE AND SCREEN
ABO/RH(D): A POS
ANTIBODY SCREEN: NEGATIVE

## 2014-11-18 LAB — RPR: RPR Ser Ql: NONREACTIVE

## 2014-11-18 LAB — GLUCOSE, RANDOM: GLUCOSE: 142 mg/dL — AB (ref 65–99)

## 2014-11-18 MED ORDER — SODIUM CHLORIDE 0.9 % IV SOLN: INTRAVENOUS | Status: DC | PRN

## 2014-11-18 MED ORDER — OXYCODONE-ACETAMINOPHEN 5-325 MG PO TABS
1.0000 | ORAL_TABLET | ORAL | Status: DC | PRN
  Administered 2014-11-19 – 2014-11-20 (×6): 1 via ORAL
  Filled 2014-11-18 (×6): qty 1

## 2014-11-18 MED ORDER — PHENYLEPHRINE 40 MCG/ML (10ML) SYRINGE FOR IV PUSH (FOR BLOOD PRESSURE SUPPORT)
80.0000 ug | PREFILLED_SYRINGE | INTRAVENOUS | Status: DC | PRN
Start: 2014-11-18 — End: 2014-11-18
  Filled 2014-11-18: qty 20
  Filled 2014-11-18: qty 2

## 2014-11-18 MED ORDER — PRENATAL MULTIVITAMIN CH
1.0000 | ORAL_TABLET | Freq: Every day | ORAL | Status: DC
  Administered 2014-11-19 – 2014-11-20 (×2): 1 via ORAL
  Filled 2014-11-18 (×2): qty 1

## 2014-11-18 MED ORDER — ONDANSETRON HCL 4 MG/2ML IJ SOLN: 4.0000 mg | Freq: Four times a day (QID) | INTRAMUSCULAR | Status: DC | PRN

## 2014-11-18 MED ORDER — INSULIN ASPART 100 UNIT/ML ~~LOC~~ SOLN: 0.0000 [IU] | SUBCUTANEOUS | Status: DC

## 2014-11-18 MED ORDER — SODIUM CHLORIDE 0.9 % IJ SOLN
3.0000 mL | Freq: Two times a day (BID) | INTRAMUSCULAR | Status: DC
  Administered 2014-11-19: 3 mL via INTRAVENOUS

## 2014-11-18 MED ORDER — BISACODYL 10 MG RE SUPP: 10.0000 mg | Freq: Every day | RECTAL | Status: DC | PRN

## 2014-11-18 MED ORDER — SIMETHICONE 80 MG PO CHEW: 80.0000 mg | CHEWABLE_TABLET | ORAL | Status: DC | PRN

## 2014-11-18 MED ORDER — MEASLES, MUMPS & RUBELLA VAC ~~LOC~~ INJ
0.5000 mL | INJECTION | Freq: Once | SUBCUTANEOUS | Status: DC
  Filled 2014-11-18: qty 0.5

## 2014-11-18 MED ORDER — FENTANYL 2.5 MCG/ML BUPIVACAINE 1/10 % EPIDURAL INFUSION (WH - ANES)
14.0000 mL/h | INTRAMUSCULAR | Status: DC | PRN
  Administered 2014-11-18: 14 mL/h via EPIDURAL
  Filled 2014-11-18 (×2): qty 125

## 2014-11-18 MED ORDER — ONDANSETRON HCL 4 MG PO TABS: 4.0000 mg | ORAL_TABLET | ORAL | Status: DC | PRN

## 2014-11-18 MED ORDER — SODIUM CHLORIDE 0.9 % IJ SOLN: 3.0000 mL | INTRAMUSCULAR | Status: DC | PRN

## 2014-11-18 MED ORDER — WITCH HAZEL-GLYCERIN EX PADS: 1.0000 "application " | MEDICATED_PAD | CUTANEOUS | Status: DC | PRN

## 2014-11-18 MED ORDER — SODIUM CHLORIDE 0.9 % IV SOLN
2.0000 g | Freq: Once | INTRAVENOUS | Status: AC
  Administered 2014-11-18: 2 g via INTRAVENOUS
  Filled 2014-11-18: qty 2000

## 2014-11-18 MED ORDER — BENZOCAINE-MENTHOL 20-0.5 % EX AERO
1.0000 "application " | INHALATION_SPRAY | CUTANEOUS | Status: DC | PRN
  Administered 2014-11-19 – 2014-11-20 (×2): 1 via TOPICAL
  Filled 2014-11-18 (×2): qty 56

## 2014-11-18 MED ORDER — PENICILLIN G POTASSIUM 5000000 UNITS IJ SOLR
5.0000 10*6.[IU] | Freq: Once | INTRAVENOUS | Status: AC
  Administered 2014-11-18: 5 10*6.[IU] via INTRAVENOUS
  Filled 2014-11-18: qty 5

## 2014-11-18 MED ORDER — LACTATED RINGERS IV SOLN
500.0000 mL | INTRAVENOUS | Status: DC | PRN
  Administered 2014-11-18: 500 mL via INTRAVENOUS

## 2014-11-18 MED ORDER — LACTATED RINGERS IV SOLN
INTRAVENOUS | Status: DC
  Administered 2014-11-18 (×2): via INTRAVENOUS

## 2014-11-18 MED ORDER — DEXTROSE IN LACTATED RINGERS 5 % IV SOLN
INTRAVENOUS | Status: DC
  Administered 2014-11-18: 08:00:00 via INTRAVENOUS

## 2014-11-18 MED ORDER — ACETAMINOPHEN 325 MG PO TABS: 650.0000 mg | ORAL_TABLET | ORAL | Status: DC | PRN

## 2014-11-18 MED ORDER — ZOLPIDEM TARTRATE 5 MG PO TABS: 5.0000 mg | ORAL_TABLET | Freq: Every evening | ORAL | Status: DC | PRN

## 2014-11-18 MED ORDER — SODIUM CHLORIDE 0.9 % IV SOLN: 250.0000 mL | INTRAVENOUS | Status: DC | PRN

## 2014-11-18 MED ORDER — DOCUSATE SODIUM 100 MG PO CAPS
100.0000 mg | ORAL_CAPSULE | Freq: Two times a day (BID) | ORAL | Status: DC
  Administered 2014-11-18 – 2014-11-20 (×4): 100 mg via ORAL
  Filled 2014-11-18 (×4): qty 1

## 2014-11-18 MED ORDER — CITRIC ACID-SODIUM CITRATE 334-500 MG/5ML PO SOLN: 30.0000 mL | ORAL | Status: DC | PRN

## 2014-11-18 MED ORDER — INSULIN REGULAR BOLUS VIA INFUSION
0.0000 [IU] | Freq: Three times a day (TID) | INTRAVENOUS | Status: DC
  Filled 2014-11-18: qty 10

## 2014-11-18 MED ORDER — IBUPROFEN 600 MG PO TABS
600.0000 mg | ORAL_TABLET | Freq: Four times a day (QID) | ORAL | Status: DC
  Administered 2014-11-18 – 2014-11-20 (×8): 600 mg via ORAL
  Filled 2014-11-18 (×8): qty 1

## 2014-11-18 MED ORDER — DIPHENHYDRAMINE HCL 25 MG PO CAPS: 25.0000 mg | ORAL_CAPSULE | Freq: Four times a day (QID) | ORAL | Status: DC | PRN

## 2014-11-18 MED ORDER — EPHEDRINE 5 MG/ML INJ
10.0000 mg | INTRAVENOUS | Status: DC | PRN
Start: 2014-11-18 — End: 2014-11-18
  Filled 2014-11-18: qty 2

## 2014-11-18 MED ORDER — INSULIN REGULAR HUMAN 100 UNIT/ML IJ SOLN
INTRAMUSCULAR | Status: DC
  Administered 2014-11-18: 0.9 [IU]/h via INTRAVENOUS
  Filled 2014-11-18 (×2): qty 2.5

## 2014-11-18 MED ORDER — OXYCODONE-ACETAMINOPHEN 5-325 MG PO TABS: 1.0000 | ORAL_TABLET | ORAL | Status: DC | PRN

## 2014-11-18 MED ORDER — OXYCODONE-ACETAMINOPHEN 5-325 MG PO TABS: 2.0000 | ORAL_TABLET | ORAL | Status: DC | PRN

## 2014-11-18 MED ORDER — OXYCODONE-ACETAMINOPHEN 5-325 MG PO TABS
2.0000 | ORAL_TABLET | ORAL | Status: DC | PRN
  Administered 2014-11-20: 2 via ORAL
  Filled 2014-11-18: qty 2

## 2014-11-18 MED ORDER — OXYTOCIN 40 UNITS IN LACTATED RINGERS INFUSION - SIMPLE MED
1.0000 m[IU]/min | INTRAVENOUS | Status: DC
  Administered 2014-11-18: 2 m[IU]/min via INTRAVENOUS

## 2014-11-18 MED ORDER — FENTANYL CITRATE (PF) 100 MCG/2ML IJ SOLN
100.0000 ug | INTRAMUSCULAR | Status: DC | PRN
  Administered 2014-11-18: 100 ug via INTRAVENOUS
  Filled 2014-11-18: qty 2

## 2014-11-18 MED ORDER — OXYTOCIN BOLUS FROM INFUSION
500.0000 mL | INTRAVENOUS | Status: DC
  Administered 2014-11-18: 500 mL via INTRAVENOUS

## 2014-11-18 MED ORDER — SODIUM CHLORIDE 0.9 % IV SOLN: INTRAVENOUS | Status: DC

## 2014-11-18 MED ORDER — LIDOCAINE HCL (PF) 1 % IJ SOLN
INTRAMUSCULAR | Status: DC | PRN
  Administered 2014-11-18 (×2): 5 mL

## 2014-11-18 MED ORDER — SENNOSIDES-DOCUSATE SODIUM 8.6-50 MG PO TABS
2.0000 | ORAL_TABLET | ORAL | Status: DC
  Administered 2014-11-19: 2 via ORAL
  Filled 2014-11-18: qty 2

## 2014-11-18 MED ORDER — LANOLIN HYDROUS EX OINT: TOPICAL_OINTMENT | CUTANEOUS | Status: DC | PRN

## 2014-11-18 MED ORDER — DIPHENHYDRAMINE HCL 50 MG/ML IJ SOLN: 12.5000 mg | INTRAMUSCULAR | Status: DC | PRN

## 2014-11-18 MED ORDER — METFORMIN HCL 500 MG PO TABS
1000.0000 mg | ORAL_TABLET | Freq: Two times a day (BID) | ORAL | Status: DC
  Administered 2014-11-19 – 2014-11-20 (×3): 1000 mg via ORAL
  Filled 2014-11-18 (×5): qty 2

## 2014-11-18 MED ORDER — SODIUM CHLORIDE 0.9 % IV SOLN
INTRAVENOUS | Status: DC
  Filled 2014-11-18: qty 2.5

## 2014-11-18 MED ORDER — DIBUCAINE 1 % RE OINT: 1.0000 "application " | TOPICAL_OINTMENT | RECTAL | Status: DC | PRN

## 2014-11-18 MED ORDER — LIDOCAINE HCL (PF) 1 % IJ SOLN
30.0000 mL | INTRAMUSCULAR | Status: DC | PRN
  Filled 2014-11-18: qty 30

## 2014-11-18 MED ORDER — FLEET ENEMA 7-19 GM/118ML RE ENEM: 1.0000 | ENEMA | Freq: Every day | RECTAL | Status: DC | PRN

## 2014-11-18 MED ORDER — DEXTROSE 50 % IV SOLN: 25.0000 mL | INTRAVENOUS | Status: DC | PRN

## 2014-11-18 MED ORDER — TETANUS-DIPHTH-ACELL PERTUSSIS 5-2.5-18.5 LF-MCG/0.5 IM SUSP: 0.5000 mL | Freq: Once | INTRAMUSCULAR | Status: DC

## 2014-11-18 MED ORDER — INSULIN NPH (HUMAN) (ISOPHANE) 100 UNIT/ML ~~LOC~~ SUSP
10.0000 [IU] | Freq: Two times a day (BID) | SUBCUTANEOUS | Status: DC
  Administered 2014-11-18 – 2014-11-20 (×4): 10 [IU] via SUBCUTANEOUS
  Filled 2014-11-18: qty 10

## 2014-11-18 MED ORDER — OXYTOCIN 40 UNITS IN LACTATED RINGERS INFUSION - SIMPLE MED
62.5000 mL/h | INTRAVENOUS | Status: DC
  Filled 2014-11-18: qty 1000

## 2014-11-18 MED ORDER — PENICILLIN G POTASSIUM 5000000 UNITS IJ SOLR
2.5000 10*6.[IU] | INTRAVENOUS | Status: DC
  Administered 2014-11-18 (×2): 2.5 10*6.[IU] via INTRAVENOUS
  Filled 2014-11-18 (×9): qty 2.5

## 2014-11-18 MED ORDER — ONDANSETRON HCL 4 MG/2ML IJ SOLN: 4.0000 mg | INTRAMUSCULAR | Status: DC | PRN

## 2014-11-18 MED ORDER — INSULIN REGULAR BOLUS VIA INFUSION
0.0000 [IU] | Freq: Three times a day (TID) | INTRAVENOUS | Status: DC | PRN
  Filled 2014-11-18: qty 10

## 2014-11-18 MED ORDER — TERBUTALINE SULFATE 1 MG/ML IJ SOLN: 0.2500 mg | Freq: Once | INTRAMUSCULAR | Status: DC | PRN

## 2014-11-18 NOTE — Anesthesia Preprocedure Evaluation (Signed)
Anesthesia Evaluation  Patient identified by MRN, date of birth, ID band Patient awake    Reviewed: Allergy & Precautions, H&P , NPO status , Patient's Chart, lab work & pertinent test results  History of Anesthesia Complications Negative for: history of anesthetic complications  Airway Mallampati: II  TM Distance: >3 FB Neck ROM: full    Dental no notable dental hx. (+) Teeth Intact   Pulmonary neg pulmonary ROS,    Pulmonary exam normal breath sounds clear to auscultation       Cardiovascular negative cardio ROS Normal cardiovascular exam Rhythm:regular Rate:Normal     Neuro/Psych negative neurological ROS  negative psych ROS   GI/Hepatic negative GI ROS, Neg liver ROS,   Endo/Other  diabetes  Renal/GU negative Renal ROS  negative genitourinary   Musculoskeletal   Abdominal   Peds  Hematology negative hematology ROS (+)   Anesthesia Other Findings   Reproductive/Obstetrics (+) Pregnancy                             Anesthesia Physical Anesthesia Plan  ASA: II  Anesthesia Plan: Epidural   Post-op Pain Management:    Induction:   Airway Management Planned:   Additional Equipment:   Intra-op Plan:   Post-operative Plan:   Informed Consent: I have reviewed the patients History and Physical, chart, labs and discussed the procedure including the risks, benefits and alternatives for the proposed anesthesia with the patient or authorized representative who has indicated his/her understanding and acceptance.     Plan Discussed with:   Anesthesia Plan Comments:         Anesthesia Quick Evaluation

## 2014-11-18 NOTE — Progress Notes (Signed)
Patient ID: Makayla Meyer, female   DOB: 07-02-1990, 24 y.o.   MRN: [id number] Makayla Meyer is a 24 y.o. G2P0010 at 3664w2d admitted for rupture of membranes. 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   Subjective: Comfortable w/ epidural, some pressure  Objective: BP 121/81 mmHg  Pulse 98  Temp(Src) 98.1 F (36.7 C) (Oral)  Resp 18  Ht 5\' 2"  (1.575 m)  Wt 67.405 kg (148 lb 9.6 oz)  BMI 27.17 kg/m2  SpO2 99%  LMP 03/09/2014 (Exact Date)    FHT:  FHR: 125 bpm, variability: moderate,  accelerations:  Present,  decelerations:  Absent UC:   regular  SVE:   Dilation: 9 Effacement (%): 100 Station: +1 Exam by:: L. Julien GirtMcDaniel Rn  Pitocin @ 18 mu/min  Labs: Lab Results  Component Value Date   WBC 13.0* 11/18/2014   HGB 10.8* 11/18/2014   HCT 31.9* 11/18/2014   MCV 80.6 11/18/2014   PLT 257 11/18/2014    Assessment / Plan: Progressing normally w/ pitocin augmentation  Labor: Progressing normally Fetal Wellbeing:  Category I Pain Control:  Epidural Pre-eclampsia: n/a I/D:  pcn for gbs+ Anticipated MOD:  NSVD  Marge DuncansBooker, Kimberly Randall CNM, WHNP-BC 11/18/2014, 5:52 PM

## 2014-11-18 NOTE — Progress Notes (Signed)
Patient ID: Makayla Meyer, female   DOB: 04/07/1990, 24 y.o.   MRN: [id number] Makayla Meyer is a 24 y.o. G2P0010 at 3376w2d admitted for rupture of membranes. Class B DM, currently on glucostabilizer.   Subjective: Comfortable w/ epidural  Objective: BP 127/76 mmHg  Pulse 67  Temp(Src) 98.7 F (37.1 C) (Oral)  Resp 16  Ht 5\' 2"  (1.575 m)  Wt 67.405 kg (148 lb 9.6 oz)  BMI 27.17 kg/m2  SpO2 99%  LMP 03/09/2014 (Exact Date)    FHT:  FHR: 125 bpm, variability: moderate,  accelerations:  Present,  decelerations:  Absent UC:   regular, every 2-4 minutes  SVE:   Dilation: 6 Effacement (%): 100 Station: +1 Exam by:: Tressia DanasL. McDaniel, RN  Pitocin @ 10 mu/min  Labs: Lab Results  Component Value Date   WBC 13.0* 11/18/2014   HGB 10.8* 11/18/2014   HCT 31.9* 11/18/2014   MCV 80.6 11/18/2014   PLT 257 11/18/2014     Assessment / Plan: Augmentation of labor, progressing well  Labor: Progressing normally Fetal Wellbeing:  Category I Pain Control:  Epidural Pre-eclampsia: n/a I/D:  pcn for gbs+ Anticipated MOD:  NSVD  Marge DuncansBooker, Telesha Deguzman Randall CNM, WHNP-BC 11/18/2014, 11:33 AM

## 2014-11-18 NOTE — MAU Note (Signed)
[facility]. Dorene GrebeNatalie RN will call back with Rm #

## 2014-11-18 NOTE — Progress Notes (Signed)
Report given and care relinquished L.McDaniel, RN.

## 2014-11-18 NOTE — Progress Notes (Signed)
Epidural procedure completed. Pt assisted to low-fowlers with left tilt for uterine displacement. EFM re-applied to soft abd. FHR located in RLQ at 140 BPM. Mobility instructions R/T epidural cath reviewed, pt verbalizes understanding and can return correct demo.    

## 2014-11-18 NOTE — Progress Notes (Signed)
Dr.Carignan of anesthesia to bedside, informed consent present including risks and benefits. Pt agrees to procedure, consent signed, pt assisted to sitting position on side of bed. Cat I FHR surveillance noted, EFM removed for procedure.

## 2014-11-18 NOTE — Progress Notes (Signed)
Pt vomited.   

## 2014-11-18 NOTE — Anesthesia Procedure Notes (Signed)
Epidural Patient location during procedure: OB  Staffing Anesthesiologist: Halli Equihua Performed by: anesthesiologist   Preanesthetic Checklist Completed: patient identified, site marked, surgical consent, pre-op evaluation, timeout performed, IV checked, risks and benefits discussed and monitors and equipment checked  Epidural Patient position: sitting Prep: DuraPrep Patient monitoring: heart rate, continuous pulse ox and blood pressure Approach: right paramedian Location: L3-L4 Injection technique: LOR saline  Needle:  Needle type: Tuohy  Needle gauge: 17 G Needle length: 9 cm and 9 Needle insertion depth: 5 cm Catheter type: closed end flexible Catheter size: 20 Guage Catheter at skin depth: 9 cm Test dose: negative  Assessment Events: blood not aspirated, injection not painful, no injection resistance, negative IV test and no paresthesia  Additional Notes Patient identified. Risks/Benefits/Options discussed with patient including but not limited to bleeding, infection, nerve damage, paralysis, failed block, incomplete pain control, headache, blood pressure changes, nausea, vomiting, reactions to medication both or allergic, itching and postpartum back pain. Confirmed with bedside nurse the patient's most recent platelet count. Confirmed with patient that they are not currently taking any anticoagulation, have any bleeding history or any family history of bleeding disorders. Patient expressed understanding and wished to proceed. All questions were answered. Sterile technique was used throughout the entire procedure. Please see nursing notes for vital signs. Test dose was given through epidural needle and negative prior to continuing to dose epidural or start infusion. Warning signs of high block given to the patient including shortness of breath, tingling/numbness in hands, complete motor block, or any concerning symptoms with instructions to call for help. Patient was given  instructions on fall risk and not to get out of bed. All questions and concerns addressed with instructions to call with any issues.   

## 2014-11-18 NOTE — H&P (Signed)
OBSTETRIC ADMISSION HISTORY AND PHYSICAL  Makayla Meyer is a 24 y.o. female G2P0010 with IUP at 76w2dby L/12 presenting for leaking fluid/ROM. She reports +FMs, no VB, no blurry vision, headaches or peripheral edema, and RUQ pain.  She plans on breast feeding. She request IUD-paraguard for birth control.  Dating: By L/12 --->  Estimated Date of Delivery: 12/14/14  Sono:   @[redacted]w[redacted]d , anterior placenta above os, normal anatomy @33wk  2993g >90th% @35wk0d - AFI 25.8, dx with poly  Prenatal History/Complications:  [facility]  Dating LMP + 12 wk u/s 5 day difference Blood type: A/Positive/-- (04/25 0000)   Genetic Screen 1 Screen: WNL   AFP:   WNL  Antibody:Negative (04/25 0000)  Anatomic [place] Normal Rubella: Immune (04/25 0000)  GTT DM RPR: Nonreactive (04/25 0000)   Flu vaccine Declined HBsAg: Negative (04/25 0000)   TDaP vaccine    09/11/14                                          HIV: Non-reactive (04/25 0000)   GBS  positive                                        GBS: positive  Contraception Paragard IUD Pap: Nml  Baby Food Breast   Circumcision If female, yes   Pediatrician Unsure   Support Person FOB     Past Medical History: Past Medical History  Diagnosis Date  . [place] mellitus [place] complication [place]   . UTI (lower urinary tract infection)     Past Surgical History: Past Surgical History  Procedure Laterality Date  . No past surgeries      Obstetrical History: OB History    Gravida Para Term Preterm AB TAB SAB Ectopic Multiple Living   2    1  1          Social History: Social History   Social History  . Marital Status: Single    Spouse Name: N/A  . Number of Children: N/A  . Years of Education: N/A   Social History Main Topics  . Smoking status: Never Smoker   . Smokeless tobacco: Never Used  . Alcohol Use: No     Comment: before pregnancy  . Drug Use: No     Comment: occasionally before pregnancy  . Sexual Activity: Yes    Birth  Control/ Protection: None   Other Topics Concern  . Not on file   Social History Narrative    Family History: Family History  Problem Relation Age of Onset  . Hypertension Mother   . Asthma Mother   . Diabetes Mother   . Depression Mother   . Diabetes Sister     Allergies: No Known Allergies  Prescriptions prior to admission  Medication Sig Dispense Refill Last Dose  . ACCU-CHEK FASTCLIX LANCETS MISC 1 Units by Does not apply route 4 (four) times daily. 102 each 3 11/18/2014 at Unknown time  . acetaminophen (TYLENOL) 500 MG tablet Take 1,000 mg by mouth every 6 (six) hours as needed for headache.   Past Month at Unknown time  . aspirin 81 MG chewable tablet Chew 1 tablet (81 mg total) by mouth daily. 90 tablet 3 11/17/2014 at Unknown time  . Blood Glucose Monitoring Suppl (ACCU-CHEK NANO SMARTVIEW)  W/DEVICE KIT 1 Device by Does not apply route 4 (four) times daily. 1 kit 0 11/17/2014 at Unknown time  . glucose blood (ACCU-CHEK SMARTVIEW) test strip 1 strip four times daily 51 each 12 11/17/2014 at Unknown time  . insulin aspart (NOVOLOG) 100 UNIT/ML injection Inject 18 units into the skin daily @ breakfast and 20 units daily @ lunch and dinner 10 mL 11 11/17/2014 at 1300  . insulin NPH Human (HUMULIN N,NOVOLIN N) 100 UNIT/ML injection Inject 22 units in the morning and 25 units before bedtime. 10 mL 11 11/17/2014 at 0900  . prenatal vitamin w/FE, FA (PRENATAL 1 + 1) 27-1 MG TABS tablet Take 1 tablet by mouth daily at 12 noon. (Patient taking differently: Take 1 tablet by mouth at bedtime. ) 30 each 10 11/18/2014 at Unknown time  . progesterone (PROMETRIUM) 200 MG capsule Place 200 mg vaginally at bedtime.    Past Week at Unknown time     Review of Systems  All systems reviewed and negative except as stated in HPI  Blood pressure 131/83, pulse 88, temperature 97.4 F (36.3 C), resp. rate 20, height 5' 2"  (1.575 m), weight 148 lb 9.6 oz (67.405 kg), last menstrual period  03/09/2014. General appearance: alert, cooperative and appears stated age Lungs: clear to auscultation bilaterally Heart: regular rate and rhythm Abdomen: Gravid. soft, non-tender; bowel sounds normal Pelvic: adequate Extremities: Homans sign is negative, no sign of DVT. +1 pitting on left to midshin, trace on the right.   Presentation: cephalic Fetal monitoringBaseline: 130 bpm, Variability: Good {> 6 bpm), Accelerations: Reactive and Decelerations: Absent Uterine activityFrequency: Every 5 minutes Dilation: 5.5 Effacement (%): 80 Exam by:: Dr Ola Spurr   Prenatal labs: ABO, Rh: --/--/A POS (10/06 2230) Antibody: NEG (10/06 2230) Rubella: IMMUNE RPR: NON REAC (08/22 1018)  HBsAg: Negative (04/25 0000)  HIV: Non Reactive (09/01 1609)  GBS: Positive (09/01 0000)  1 hr Glucola n/a known Type 2 DM Genetic screening  wnl Anatomy US wnl  Prenatal Transfer Tool  Maternal Diabetes: Yes:  Diabetes Type:  Pre-pregnancy Genetic Screening: Normal Maternal Ultrasounds/Referrals: Abnormal:  Findings:   Other:Polyhydamnios Fetal Ultrasounds or other Referrals:  Fetal echo Maternal Substance Abuse:  No Significant Maternal Medications:  Meds include: Other: NPH and Aspart Significant Maternal Lab Results: Lab values include: Group B Strep positive  Results for orders placed or performed during the hospital encounter of 11/17/14 (from the past 24 hour(s))  [facility] Time: 11/18/14 12:21 AM  Result Value Ref Range   POCT Fern Test Positive = ruptured amniotic membanes     Patient Active Problem List   Diagnosis Date Noted  . Preterm premature rupture of membranes (PPROM) with onset of labor within 24 hours of rupture in third trimester, antepartum 11/18/2014  . 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 Threatened preterm labor 10/26/2014  . Polyhydramnios in third trimester, antepartum 10/22/2014  . Group B Streptococcus carrier, +RV  culture, currently pregnant 09/24/2014  . Cervical shortening affecting pregnancy, antepartum   . Diabetes mellitus affecting pregnancy, antepartum 05/29/2014  . Supervision of high risk pregnancy, antepartum 05/29/2014    Assessment: Brendaly Townsel is a 24 y.o. G2P0010 at 72w2dhere for PPROM   #Labor: PPROM with active labor. Expectant management.  If labor slows, start pitocin given favorable cervix.  #Pain: Prn iv meds. Epidural prn #FWB: Cat I,  Received BMZ x2 courses on 8/31&9/1 and then again 10/3&10/7. No need for repeat course of  betamethasone. #ID:  GBS positive, Ampicillin givev advanced labor #MOF: breast #MOC: IUD #Circ:  Desired if female  #Class B GDM: On NPH and Aspart. Will check CBG q 4 hours in latent and then q2 in active labor. Glucostablizer if CBG > 120.   Juanita Craver [facility] 11/18/2014, 12:48 AM

## 2014-11-18 NOTE — Progress Notes (Signed)
Dr Sampson GoonFitzgerald on unit and aware of pt's admission and status.

## 2014-11-19 ENCOUNTER — Encounter (HOSPITAL_COMMUNITY): Payer: Self-pay

## 2014-11-19 LAB — GLUCOSE, CAPILLARY
GLUCOSE-CAPILLARY: 107 mg/dL — AB (ref 65–99)
GLUCOSE-CAPILLARY: 66 mg/dL (ref 65–99)
GLUCOSE-CAPILLARY: 173 mg/dL — AB (ref 65–99)
Glucose-Capillary: 151 mg/dL — ABNORMAL HIGH (ref 65–99)
Glucose-Capillary: 191 mg/dL — ABNORMAL HIGH (ref 65–99)

## 2014-11-19 LAB — CBC
HCT: 24.1 % — ABNORMAL LOW (ref 36.0–46.0)
HEMOGLOBIN: 8.1 g/dL — AB (ref 12.0–15.0)
MCH: 27.1 pg (ref 26.0–34.0)
MCHC: 33.6 g/dL (ref 30.0–36.0)
MCV: 80.6 fL (ref 78.0–100.0)
Platelets: 202 10*3/uL (ref 150–400)
RBC: 2.99 MIL/uL — AB (ref 3.87–5.11)
RDW: 14.6 % (ref 11.5–15.5)
WBC: 15.7 10*3/uL — AB (ref 4.0–10.5)

## 2014-11-19 NOTE — Lactation Note (Signed)
This note was copied from the chart of Makayla Meyer [place]. Lactation Consultation Note; Mom reports she has pumped 3 times but did not obtain any milk- reassurance given. NICU booklet given to mom- reviewed pumping log. Reviewed hand expression with mom. Encouraged to pump and then try hand expression afterwards. Getting ready to pump after she goes to the bathroom. No questions at present. Has WIC.   Patient Name: Makayla Meyer Pizzini GNFAO'ZToday's Date: 11/19/2014 Reason for consult: Initial assessment;NICU baby   Maternal Data Formula Feeding for Exclusion: No Has patient been taught Hand Expression?: Yes Does the patient have breastfeeding experience prior to this delivery?: No  Feeding Feeding Type: Formula Nipple Type: Slow - flow Length of feed: 15 min  LATCH Score/Interventions                      Lactation Tools Discussed/Used WIC Program: Yes Pump Review: Setup, frequency, and cleaning Initiated by:: RN Date initiated:: 11/19/14   Consult Status      Pamelia HoitWeeks, Cleotha Whalin D 11/19/2014, 1:51 PM

## 2014-11-19 NOTE — Anesthesia Postprocedure Evaluation (Signed)
Anesthesia Post Note  Patient: Makayla Meyer  Procedure(s) Performed: * No procedures listed *  Anesthesia type: Epidural  Patient location: Mother/Baby  Post pain: Pain level controlled  Post assessment: Post-op Vital signs reviewed  Last Vitals:  Filed Vitals:   11/19/14 0518  BP: 123/78  Pulse: 89  Temp: 36.7 C  Resp: 20    Post vital signs: Reviewed  Level of consciousness:alert  Complications: No apparent anesthesia complications

## 2014-11-19 NOTE — Progress Notes (Signed)
Results for Tanna SavoyJONES, Amere (MRN [id number]) as of 11/19/2014 09:00  Ref. Range 11/19/2014 08:18  Glucose-Capillary Latest Ref Range: 65-99 mg/dL 213191 (H)  Zerita Boersarlene Lawson, CNM notified of above result and no orders received.

## 2014-11-19 NOTE — Progress Notes (Signed)
Post Partum Day 1  Subjective:  Makayla Meyer is a 24 y.o. [id number] 439w2d s/p SVD @ 1911 11/18/14. Delivery complicated by 4th degree perineal laceration and 2-minute shoulder dystocia. No acute events overnight.  Pt denies problems with ambulating, voiding or po intake.  She denies nausea or vomiting.  Pain is well controlled. Patient reports dermoplast and percocet provide good pain relief, although pain has increased since epidural has worn off. She has had flatus. She has not had bowel movement.  Lochia Small.  Plan for birth control is IUD.  Method of Feeding: Breast  Objective: BP 123/78 mmHg  Pulse 89  Temp(Src) 98.1 F (36.7 C) (Oral)  Resp 20  Ht 5\' 2"  (1.575 m)  Wt 67.405 kg (148 lb 9.6 oz)  BMI 27.17 kg/m2  SpO2 99%  LMP 03/09/2014 (Exact Date)  Breastfeeding? Unknown  Physical Exam:  General: alert, cooperative and no distress Lochia: normal flow Chest: CTAB Heart: RRR no m/r/g Abdomen: +BS, soft, nontender Uterine Fundus: firm, at umbilicus DVT Evaluation: No evidence of DVT seen on physical exam. Extremities: 1+ LE edema bilaterally.    Recent Labs  11/18/14 0045 11/19/14 0510  HGB 10.8* 8.1*  HCT 31.9* 24.1*    Assessment/Plan:  ASSESSMENT: Makayla Meyer is a 24 y.o. [id number] 2339w2d ppd #1 s/p NSVD doing well. Continue to monitor CBGs q4h, as patient has Class B DM. Started on 10 u NPH BID and metformin 1 gm BID pp.   Baby is in NICU.  Plan for discharge tomorrow, Breastfeeding, Lactation consult and Contraception IUD   LOS: 1 day   Jamelle HaringHillary M Daielle Melcher, MD [facility], PGY-1 11/19/2014, 9:26 AM

## 2014-11-19 NOTE — Progress Notes (Signed)
Post Partum Day 1 Subjective: up ad lib, voiding and bottom hurts after 4th degree  Objective: Blood pressure 123/78, pulse 89, temperature 98.1 F (36.7 C), temperature source Oral, resp. rate 20, height 5\' 2"  (1.575 m), weight 148 lb 9.6 oz (67.405 kg), last menstrual period 03/09/2014, SpO2 99 %, unknown if currently breastfeeding.  Physical Exam:  General: alert, cooperative and no distress Lochia: appropriate Uterine Fundus: firm Incision:  DVT Evaluation: No evidence of DVT seen on physical exam.   Recent Labs  11/18/14 0045 11/19/14 0510  HGB 10.8* 8.1*  HCT 31.9* 24.1*   Results for orders placed or performed during the hospital encounter of 11/17/14 (from the past 24 hour(s))  Glucose, capillary     Status: Abnormal   Collection Time: 11/18/14  2:30 PM  Result Value Ref Range   Glucose-Capillary 117 (H) 65 - 99 mg/dL  Glucose, capillary     Status: None   Collection Time: 11/18/14  3:31 PM  Result Value Ref Range   Glucose-Capillary 89 65 - 99 mg/dL  Glucose, capillary     Status: None   Collection Time: 11/18/14  4:34 PM  Result Value Ref Range   Glucose-Capillary 81 65 - 99 mg/dL  Glucose, capillary     Status: None   Collection Time: 11/18/14  5:33 PM  Result Value Ref Range   Glucose-Capillary 65 65 - 99 mg/dL  Glucose, capillary     Status: Abnormal   Collection Time: 11/18/14  8:25 PM  Result Value Ref Range   Glucose-Capillary 131 (H) 65 - 99 mg/dL  Glucose, capillary     Status: Abnormal   Collection Time: 11/18/14 10:15 PM  Result Value Ref Range   Glucose-Capillary 183 (H) 65 - 99 mg/dL  Glucose, capillary     Status: Abnormal   Collection Time: 11/19/14  2:32 AM  Result Value Ref Range   Glucose-Capillary 151 (H) 65 - 99 mg/dL  CBC     Status: Abnormal   Collection Time: 11/19/14  5:10 AM  Result Value Ref Range   WBC 15.7 (H) 4.0 - 10.5 K/uL   RBC 2.99 (L) 3.87 - 5.11 MIL/uL   Hemoglobin 8.1 (L) 12.0 - 15.0 g/dL   HCT 16.124.1 (L) 09.636.0 - 04.546.0  %   MCV 80.6 78.0 - 100.0 fL   MCH 27.1 26.0 - 34.0 pg   MCHC 33.6 30.0 - 36.0 g/dL   RDW 40.914.6 81.111.5 - 91.415.5 %   Platelets 202 150 - 400 K/uL   Assessment/Plan: Plan for discharge tomorrow and Breastfeeding  4th degree care reviewed BS ok   LOS: 1 day   EURE,LUTHER H 11/19/2014, 7:57 AM

## 2014-11-20 ENCOUNTER — Other Ambulatory Visit: Payer: Medicaid Other

## 2014-11-20 LAB — GLUCOSE, CAPILLARY
GLUCOSE-CAPILLARY: 132 mg/dL — AB (ref 65–99)
Glucose-Capillary: 87 mg/dL (ref 65–99)
Glucose-Capillary: 62 mg/dL — ABNORMAL LOW (ref 65–99)

## 2014-11-20 MED ORDER — DOCUSATE SODIUM 100 MG PO CAPS: 100.0000 mg | ORAL_CAPSULE | Freq: Two times a day (BID) | ORAL | Status: AC

## 2014-11-20 MED ORDER — IBUPROFEN 600 MG PO TABS: 600.0000 mg | ORAL_TABLET | Freq: Four times a day (QID) | ORAL | Status: AC

## 2014-11-20 MED ORDER — METFORMIN HCL 1000 MG PO TABS: 1000.0000 mg | ORAL_TABLET | Freq: Two times a day (BID) | ORAL | Status: AC

## 2014-11-20 NOTE — Discharge Summary (Signed)
OB Discharge Summary  Patient Name: Makayla Meyer DOB: 08/30/1990 MRN: [id number]  Date of admission: 11/17/2014 Delivering MD: Florian Buff   Date of discharge: 11/20/2014  Admitting diagnosis: 36wks, [place], CTX Intrauterine pregnancy: [redacted]w[redacted]d    Secondary diagnosis:Principal Problem:   Preterm premature rupture of membranes (PPROM) with onset of labor within 24 hours of rupture in third trimester, antepartum Active Problems:   Diabetes mellitus affecting pregnancy, antepartum   Supervision of high risk pregnancy, antepartum   Cervical shortening affecting pregnancy, antepartum   Group B Streptococcus carrier, +RV culture, currently pregnant   Polyhydramnios in third trimester, antepartum   Amniotic fluid leaking  Additional problems diabetes     Discharge diagnosis: Preterm Pregnancy Delivered                                                                     Post partum procedures:none  Augmentation: Pitocin  Complications: None  Hospital course:  Onset of Labor With Vaginal Delivery     24y.o. yo G2P0111 at 337w2das admitted in Latent Laboron 11/17/2014. Patient had an uncomplicated labor course as follows:  Membrane Rupture Time/Date: 10:00 PM ,11/17/2014   Intrapartum Procedures: Episiotomy: None [1]                                         Lacerations:  4th degree [5];Perineal [11]  Patient had a delivery of a Viable infant. 11/18/2014  Information for the patient's newborn:  [name], [name] [[id number]]     Pateint had an uncomplicated postpartum course.  She is ambulating, tolerating a regular diet, passing flatus, and urinating well. Patient is discharged home in stable condition on No discharge date for patient encounter.. Marland Kitchen  Physical exam  Filed Vitals:   11/19/14 1157 11/19/14 1725 11/19/14 2104 11/20/14 0615  BP: 100/56 119/68 119/75 107/58  Pulse: 92 92 100 74  Temp: 98.2 F (36.8 C) 97.9 F (36.6 C) 98.2 F (36.8 C) 97.8 F (36.6  C)  TempSrc: Oral Oral Oral Oral  Resp: _0 Height:      Weight:      SpO2: 99% 100% 100% 100%   General: alert, cooperative and no distress Lochia: appropriate Uterine Fundus: firm Incision: N/A DVT Evaluation: Negative Homan's sign. No cords or calf tenderness. Labs: Lab Results  Component Value Date   WBC 15.7* 11/19/2014   HGB 8.1* 11/19/2014   HCT 24.1* 11/19/2014   MCV 80.6 11/19/2014   PLT 202 11/19/2014   CMP Latest Ref Rng 11/18/2014  Glucose 65 - 99 mg/dL 142(H)  BUN 6 - 20 mg/dL -  Creatinine 0.44 - 1.00 mg/dL -  Sodium 135 - 145 mmol/L -  Potassium 3.5 - 5.1 mmol/L -  Chloride 101 - 111 mmol/L -  CO2 22 - 32 mmol/L -  Calcium 8.9 - 10.3 mg/dL -  Total Protein 6.5 - 8.1 g/dL -  Total Bilirubin 0.3 - 1.2 mg/dL -  Alkaline Phos 38 - 126 U/L -  AST 15 - 41 U/L -  ALT 14 - 54 U/L -  Discharge instruction: per After Visit Summary and "Baby and Me Booklet".  Medications:  Current facility-administered medications:  .  sodium chloride 0.9 % injection 3 mL, 3 mL, Intravenous, Q12H, 3 mL at 11/19/14 2152 **AND** sodium chloride 0.9 % injection 3 mL, 3 mL, Intravenous, PRN **AND** 0.9 %  sodium chloride infusion, 250 mL, Intravenous, PRN, Roma Schanz, CNM .  acetaminophen (TYLENOL) tablet 650 mg, 650 mg, Oral, Q4H PRN, Roma Schanz, CNM .  benzocaine-Menthol (DERMOPLAST) 20-0.5 % topical spray 1 application, 1 application, Topical, PRN, Roma Schanz, CNM, 1 application at 93/81/82 0515 .  bisacodyl (DULCOLAX) suppository 10 mg, 10 mg, Rectal, Daily PRN, Roma Schanz, CNM .  witch hazel-glycerin (TUCKS) pad 1 application, 1 application, Topical, PRN **AND** dibucaine (NUPERCAINAL) 1 % rectal ointment 1 application, 1 application, Rectal, PRN, Roma Schanz, CNM .  diphenhydrAMINE (BENADRYL) capsule 25 mg, 25 mg, Oral, Q6H PRN, Roma Schanz, CNM .  docusate sodium (COLACE) capsule 100 mg, 100 mg, Oral, BID, Rogue Bussing, MD, 100 mg at 11/19/14 2152 .  ibuprofen (ADVIL,MOTRIN) tablet 600 mg, 600 mg, Oral, 4 times per day, Roma Schanz, CNM, 600 mg at 11/20/14 0539 .  insulin NPH Human (HUMULIN N,NOVOLIN N) injection 10 Units, 10 Units, Subcutaneous, BID AC & HS, Roma Schanz, CNM, 10 Units at 11/20/14 0036 .  lanolin ointment, , Topical, PRN, Roma Schanz, CNM .  measles, mumps and rubella vaccine (MMR) injection 0.5 mL, 0.5 mL, Subcutaneous, Once, [employer], CNM, 0.5 mL at 11/19/14 1018 .  metFORMIN (GLUCOPHAGE) tablet 1,000 mg, 1,000 mg, Oral, BID WC, Roma Schanz, CNM, 1,000 mg at 11/19/14 1720 .  ondansetron (ZOFRAN) tablet 4 mg, 4 mg, Oral, Q4H PRN **OR** ondansetron (ZOFRAN) injection 4 mg, 4 mg, Intravenous, Q4H PRN, Roma Schanz, CNM .  oxyCODONE-acetaminophen (PERCOCET/ROXICET) 5-325 MG per tablet 1 tablet, 1 tablet, Oral, Q4H PRN, Roma Schanz, CNM, 1 tablet at 11/20/14 0544 .  oxyCODONE-acetaminophen (PERCOCET/ROXICET) 5-325 MG per tablet 2 tablet, 2 tablet, Oral, Q4H PRN, Roma Schanz, CNM .  prenatal multivitamin tablet 1 tablet, 1 tablet, Oral, Q1200, Roma Schanz, CNM, 1 tablet at 11/19/14 1200 .  senna-docusate (Senokot-S) tablet 2 tablet, 2 tablet, Oral, Q24H, Roma Schanz, CNM, 2 tablet at 11/19/14 2303 .  simethicone (MYLICON) chewable tablet 80 mg, 80 mg, Oral, PRN, Roma Schanz, CNM .  sodium phosphate (FLEET) 7-19 GM/118ML enema 1 enema, 1 enema, Rectal, Daily PRN, Roma Schanz, CNM .  Tdap (BOOSTRIX) injection 0.5 mL, 0.5 mL, Intramuscular, Once, Roma Schanz, CNM .  zolpidem (AMBIEN) tablet 5 mg, 5 mg, Oral, QHS PRN, Roma Schanz, CNM  Facility-Administered Medications Ordered in Other Encounters:  .  lidocaine (PF) (XYLOCAINE) 1 % injection, , , Anesthesia Intra-op, Montez Hageman, MD, 5 mL at 11/18/14 9937 After Visit Meds:    Medication List    ASK your doctor about these medications        ACCU-CHEK  FASTCLIX LANCETS Misc  1 Units by Does not apply route 4 (four) times daily.     ACCU-CHEK NANO SMARTVIEW W/DEVICE Kit  1 Device by Does not apply route 4 (four) times daily.     acetaminophen 500 MG tablet  Commonly known as:  TYLENOL  Take 1,000 mg by mouth every 6 (six) hours as needed for headache.     aspirin 81 MG chewable tablet  Chew 1 tablet (81 mg  total) by mouth daily.     glucose blood test strip  Commonly known as:  ACCU-CHEK SMARTVIEW  1 strip four times daily     insulin aspart 100 UNIT/ML injection  Commonly known as:  NOVOLOG  Inject 18 units into the skin daily @ breakfast and 20 units daily @ lunch and dinner     insulin NPH Human 100 UNIT/ML injection  Commonly known as:  HUMULIN N,NOVOLIN N  Inject 22 units in the morning and 25 units before bedtime.     prenatal vitamin w/FE, FA 27-1 MG Tabs tablet  Take 1 tablet by mouth daily at 12 noon.     progesterone 200 MG capsule  Commonly known as:  PROMETRIUM  Place 200 mg vaginally at bedtime.        Diet: carb modified diet  Activity: Advance as tolerated. Pelvic rest for 6 weeks.   Outpatient follow up:2 weeks Follow up Appt:Future Appointments Date Time Provider [place]  11/23/2014 1:00 PM [facility] [place] 1 WH-US 203  01/08/2015 12:45 PM Myra Marijo Sanes, MD [facility]   Follow up visit: No Follow-up on file.  Postpartum contraception: IUD Mirena  Newborn Data: Live born female  Birth Weight: 8 lb 2.5 oz (3700 g) APGAR: 2, 7  Baby Feeding: Breast Disposition:NICU   11/20/2014 LAWSON, Sarajane Jews, CNM

## 2014-11-20 NOTE — Lactation Note (Signed)
This note was copied from the chart of [name]. Lactation Consultation Note  Patient Name: [name] ZOXWR'UToday's Date: 11/20/2014 Reason for Makayla Savoyconsult: Follow-up assessment;NICU baby NICU baby 2538 hours old. Mom states that she has pumped about 5 times since baby born. Discussed need to stimulate breasts at least 8 times/24 hours for 15 minutes, followed by hand expression. Mom [facility] with [facility], [facility]. Enc mom to follow-up with [facility]IC, and [facility] Assisted mom to hand express with colostrum present at both breasts. Enc mom to offer STS/nuzzling and latching at breast as baby able. Mom aware of pumping rooms in NICU. Mom aware of OP/BFSG and LC phone line assistance after D/C.   Maternal Data    Feeding    LATCH Score/Interventions                      Lactation Tools Discussed/Used     Consult Status Consult Status: PRN    Geralynn OchsWILLIARD, Lamorris Knoblock 11/20/2014, 9:16 AM

## 2014-11-20 NOTE — Clinical Social Work Maternal (Signed)
CLINICAL SOCIAL WORK MATERNAL/CHILD NOTE  Patient Details  Name: Makayla Meyer MRN: [id number] Date of Birth: 07/25/1990  Date:  11/20/2014  Clinical Social Worker Initiating Note:  Riyaan Heroux E. Cherilyn Sautter, LCSW Date/ Time Initiated:  11/20/14/1545     Child's Name:  Makayla Meyer   Legal Guardian:   (Parents: Shawnee Keelin and Jermaine Meyer)   Need for Interpreter:  None   Date of Referral:        Reason for Referral:   (No referral-NICU admission)   Referral Source:      Address:  [address] , [place] [address]  Phone number:      Household Members:  Parents (MOB states her mother lives with her.  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  She states she plans to look for a different job after a maternity leave.)   Education:  Attending college   Financial Resources:  Medicaid   Other Resources:  WIC (MOB is contemplating applying for Food Stamps.)   Cultural/Religious Considerations Which May Impact Care: None stated.  MOB's facesheet states religion as Christian.  Strengths:  Ability to meet basic needs , Compliance with medical plan , Other (Comment), Home prepared for child , Understanding of illness (MOB states she has all supplies except a car seat.  CSW informed her of program through [facility] [facility].  CSW advised MOB obtain a pediatrician list from NICU desk.)   Risk Factors/Current Problems:  None   Cognitive State:  Alert , Linear Thinking , Goal Oriented , Insightful    Mood/Affect:  Interested , Calm , Relaxed ,  Comfortable    CSW Assessment: CSW met with MOB and MGM/Debbie Wallace in MOB's third floor room/317 to introduce services, offer support and complete assessment due to baby's admission to NICU at 36.2 weeks.  MOB was pleasant and welcoming of CSW's visit.  She gave permission to talk with her mother in the room.  MOB states her mother is her greatest support person and that she moved here from [place] to live with MOB when MOB became pregnant.  MOB states she hopes her mother will stay for at least the next two years.  MGM seems agreeable to MOB's plan.  MOB reports FOB/Jermaine Meyer is involved and also lives in the home, but that she plans to separate from him and "co-parent."  She reports that he is has been here and has been supportive throughout her hospitalization.   Assessment was somewhat difficult as MGM was extremely talkative.  She spoke a lot about her family and reports that her sister died three weeks ago.  MOB states baby's middle name is after her aunt.  MOB reports multiple scares of preterm delivery during pregnancy and being thankful that she got as far as she did.  CSW celebrated this with her.  MOB states she is also thankful that she did not deliver in [place] while she was there for her aunt's funeral.  She states "if the incident that happened here would have happened there, we (she and   baby) would probably both be dead."  MOB does not seem to want to further discuss baby's birth or her feelings surrounding it at this time. CSW spoke about common emotions often experienced during the first few weeks after delivery as well as common emotions related to the NICU experience.  CSW also provided education about perinatal mood disorder and asked that MOB commit to talking with CSW and or her doctor if she has concerns about her emotions at any time.  MOB agreed.  MGM was very attentive to the conversation and commits to supporting MOB.  MOB denies any past or current symptoms of anxiety  or depression.  She states she has not cried yet, but knows that she will.  CSW encouraged her to allow herself to be emotional.   MOB reports that she has most needed baby items at home.  MGM states they do not have a car seat, but MOB thinks someone will be getting one for her.  CSW informed MOB of car seat program through [employer] [facility] if she is unable to get one on her own.  She was appreciative.  CSW provided information about SIDS precautions and both MOB and MGM seemed aware.  MOB reports that baby will be sleeping in a bassinet and that she also has a pack and play.  She reports that she receives WIC and is thinking about applying for Food Stamps.  She states she knows how to apply.  She states she was working until the beginning of October when her pregnancy became too high risk to continue working.  She plans to look for a different job after time at home with baby.  She reports that she is currently taking classes at [employer] and plans to continue.  She has not yet chosen a pediatrician and CSW advised that she get a list from the NICU desk.  CSW encouraged her to choose a pediatrician within the next few days as we will need to know who follow up will be with prior to baby's discharge.  MOB agreed.   CSW explained ongoing support services offered by NICU CSW and gave contact information.  CSW has no social concerns at this time.  CSW Plan/Description:  Patient/Family Education , Psychosocial Support and Ongoing Assessment of Needs, Information/Referral to Community Resources     Oneill Bais Elizabeth, LCSW 11/20/2014, 4:30 PM 

## 2014-11-21 LAB — GLUCOSE, CAPILLARY
GLUCOSE-CAPILLARY: 147 mg/dL — AB (ref 65–99)
GLUCOSE-CAPILLARY: 207 mg/dL — AB (ref 65–99)
GLUCOSE-CAPILLARY: 186 mg/dL — AB (ref 65–99)
GLUCOSE-CAPILLARY: 149 mg/dL — AB (ref 65–99)
Glucose-Capillary: 208 mg/dL — ABNORMAL HIGH (ref 65–99)
Glucose-Capillary: 151 mg/dL — ABNORMAL HIGH (ref 65–99)

## 2014-11-22 ENCOUNTER — Ambulatory Visit: Payer: Self-pay

## 2014-11-22 NOTE — Lactation Note (Signed)
This note was copied from the chart of Girl Tanna SavoyDeja Pittsley. Lactation Consultation Note  Patient Name: Girl Tanna SavoyDeja Koble RUEAV'WToday's Date: 11/22/2014   NICU baby 891 hours old. Saw parents as they were leaving hospital with baby. Mom states that her milk is in and baby latching well at breast. Enc mom to call as needed--mom aware of OP/BFSG and LC phone assistance after D/C.  Maternal Data    Feeding    [facility] Score/Interventions                      Lactation Tools Discussed/Used     Consult Status      Geralynn OchsWILLIARD, Armani Brar 11/22/2014, 2:11 PM

## 2014-11-23 ENCOUNTER — Ambulatory Visit (HOSPITAL_COMMUNITY): Payer: Medicaid Other | Attending: Family

## 2014-11-23 ENCOUNTER — Other Ambulatory Visit: Payer: Medicaid Other

## 2014-11-26 NOTE — Progress Notes (Signed)
NST reactive on 11/09/14

## 2014-11-27 ENCOUNTER — Other Ambulatory Visit: Payer: Medicaid Other

## 2014-11-30 ENCOUNTER — Encounter (INDEPENDENT_AMBULATORY_CARE_PROVIDER_SITE_OTHER): Payer: Self-pay

## 2015-01-08 ENCOUNTER — Ambulatory Visit: Payer: Medicaid Other | Admitting: Obstetrics & Gynecology

## 2015-01-16 ENCOUNTER — Ambulatory Visit: Payer: Medicaid Other | Admitting: Advanced Practice Midwife

## 2015-04-09 NOTE — Telephone Encounter (Signed)
Completed-  At patient request change both insulin to flexpen delivery. Completed

## 2017-03-23 ENCOUNTER — Encounter: Payer: Self-pay | Admitting: *Deleted

## 2017-03-24 IMAGING — US US MFM OB TRANSVAGINAL
1 series · 12 of 28 positions shown · non-contrast
Comparison: none

[Series 1: us mfm ob transvaginal · 47 acquisitions, 12 frames shown]
[im 2/47]
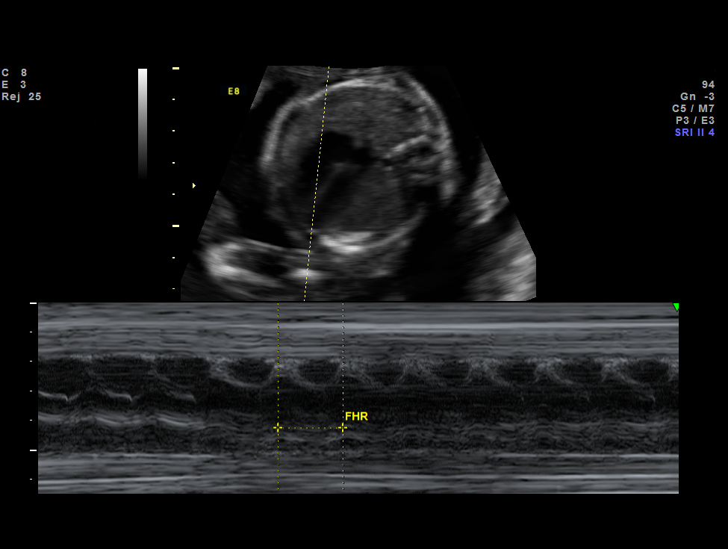
[im 6/47]
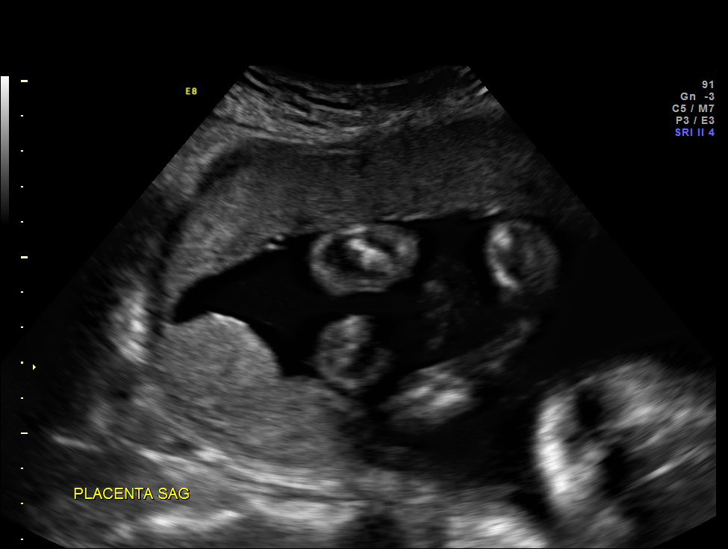
[im 9/47]
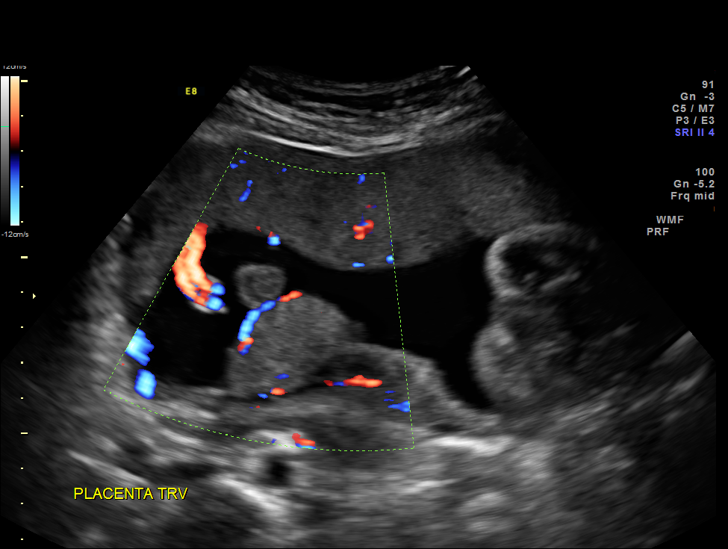
[im 14/47]
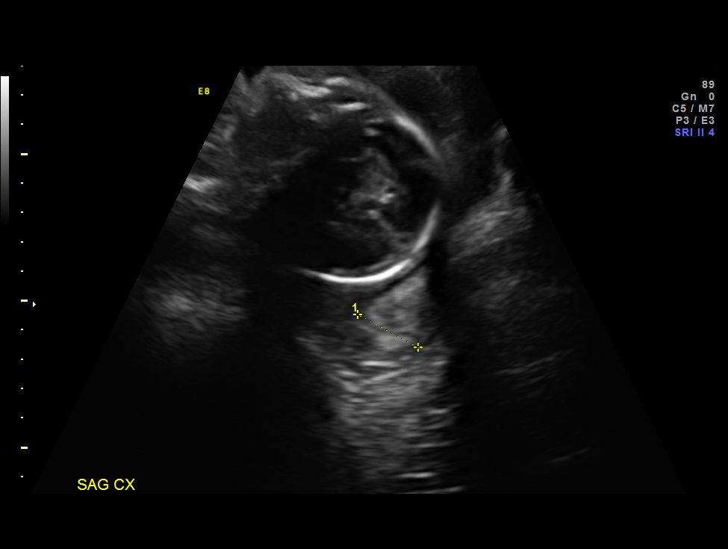
[im 18/47]
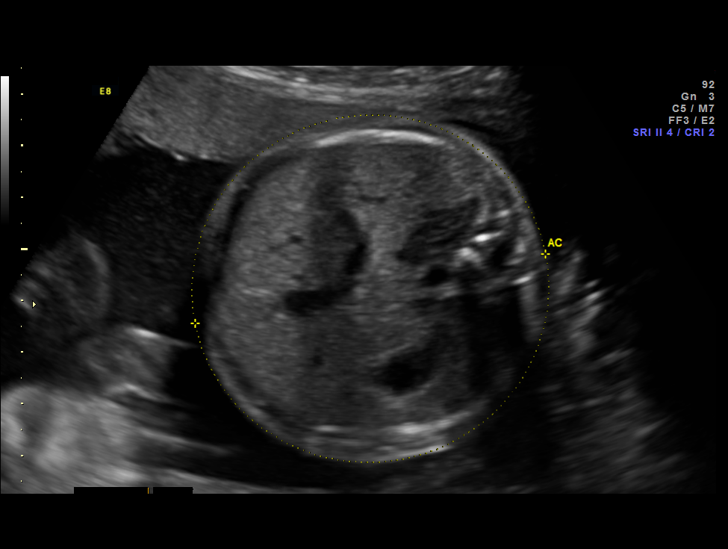
[im 21/47]
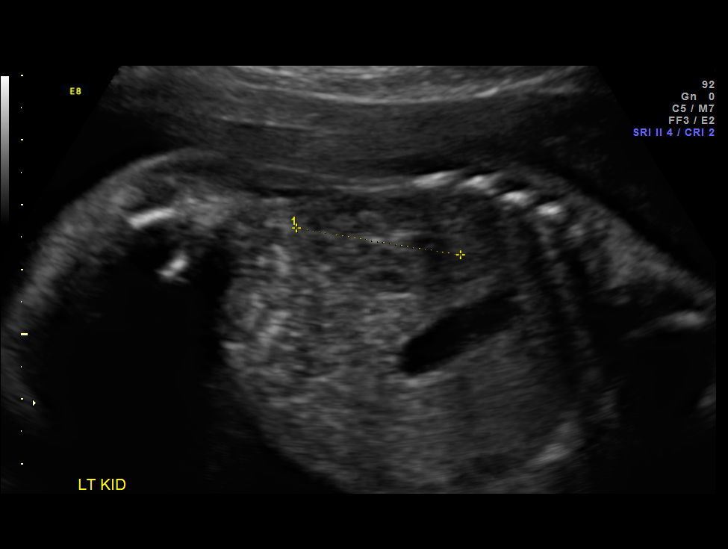
[im 26/47]
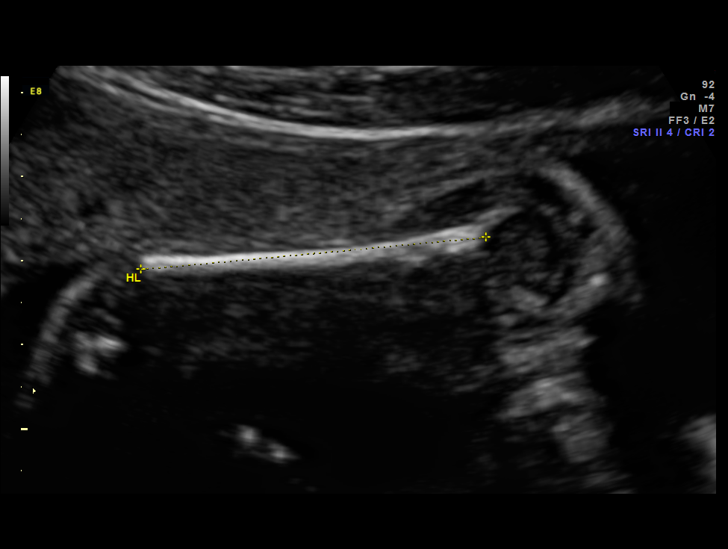
[im 29/47]
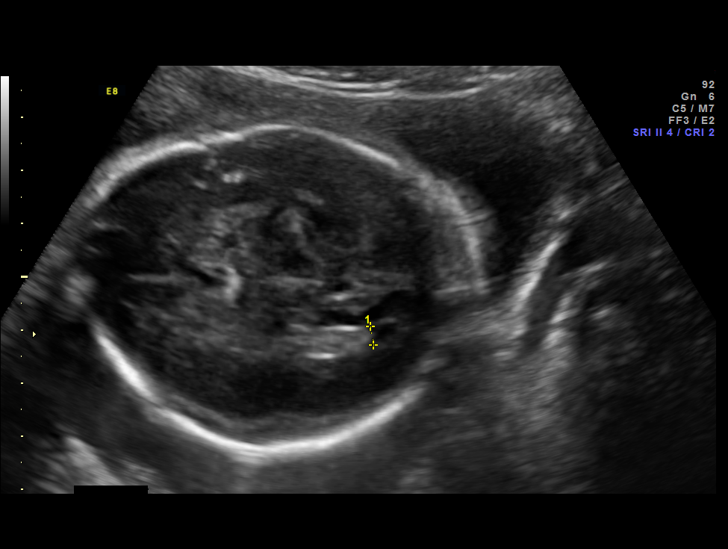
[im 33/47]
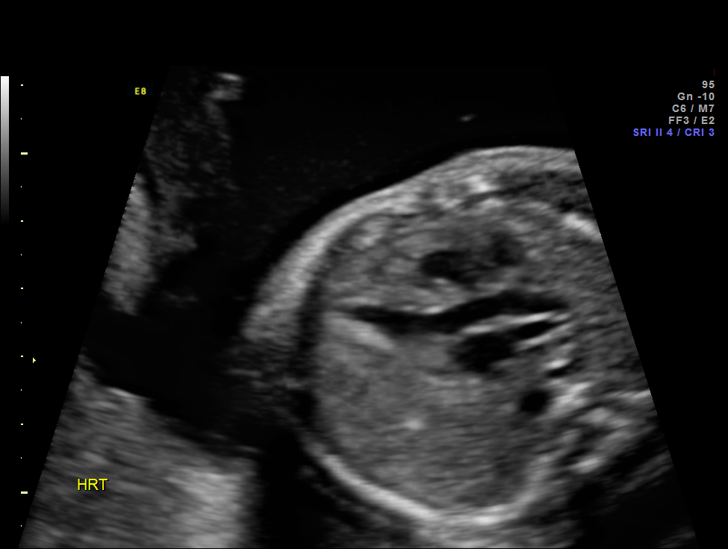
[im 38/47]
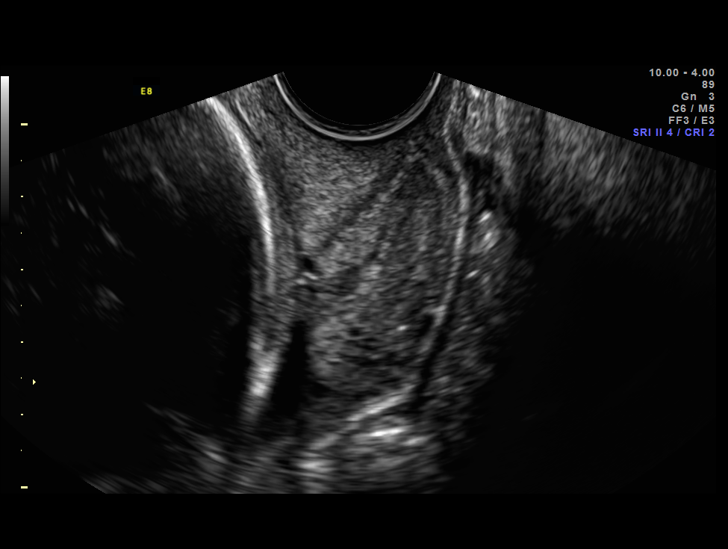
[im 41/47]
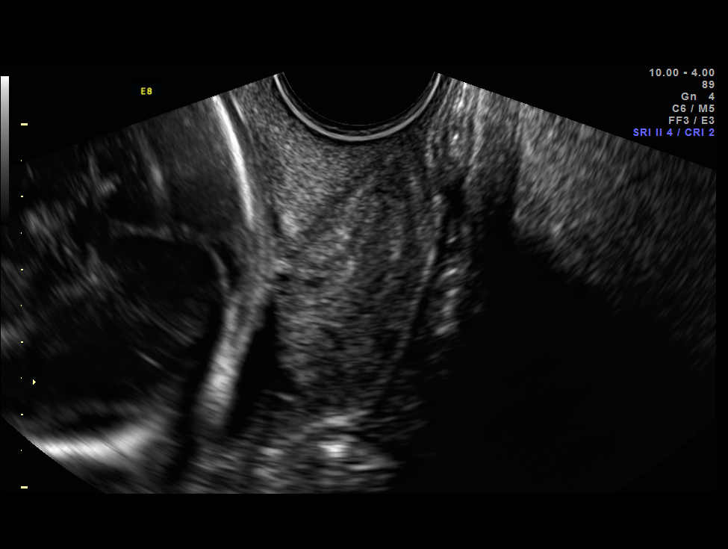
[im 45/47]
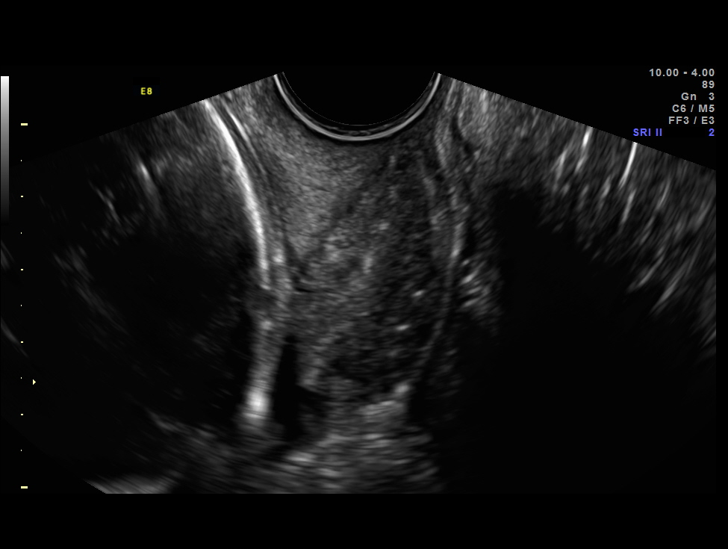

[12 of 28 positions shown; findings below may reference images not displayed]

OBSTETRICS REPORT
(Signed Final 08/21/2014 [DATE])

Name:       JOHN SNR YEOH                            Visit  08/21/2014 [DATE]
Date:

Service(s) Provided

US OB FOLLOW UP                                        76816.1
US MFM OB TRANSVAGINAL                                 76817.2
Indications

23 weeks gestation of pregnancy
Diabetes - Pregestational, 2nd trimester (on
metformin & glyburide; normal fetal ECHO)
History of genetic / anatomic abnormality - pt
with sickle cell trait
Fetal Evaluation

Num Of             1
Fetuses:
Fetal Heart        155                          bpm
Rate:
Cardiac Activity:  Observed
Presentation:      Cephalic
Placenta:          Anterior, above cervical
os
P. Cord            Previously Visualized
Insertion:

Amniotic Fluid
AFI FV:      Subjectively within normal limits
Larg Pckt:      5.5  cm
Biometry

BPD:       59   m    G. Age:   24w 1d                 CI:         76.5   70 - 86
m
OFD:     77.1   m                                     FL/HC:      20.3   18.7 -
m
HC:       219   m    G. Age:   23w 6d        48  %    HC/AC:      1.04   1.05 -
m
AC:     211.2   m    G. Age:   25w 4d        93  %    FL/BPD      75.3   71 - 87
m                                     :
FL:      44.4   m    G. Age:   24w 5d        72  %    FL/AC:      21.0   20 - 24
m
HUM:     41.3   m    G. Age:   25w 0d        74  %
m

Est.         762   gm   1 lb 11 oz      76   %
FW:
Gestational Age

LMP:           23w 4d        Date:  03/09/14                  EDD:   12/14/14
U/S Today:     24w 4d                                         EDD:   12/07/14
Best:          23w 4d    Det. By:   LMP  (03/09/14)           EDD:   12/14/14
Anatomy

Cranium:          Appears normal         Aortic Arch:       Previously seen
Fetal Cavum:      Appears normal         Ductal Arch:       Previously seen
Ventricles:       Appears normal         Diaphragm:         Previously seen
Choroid Plexus:   Previously seen        Stomach:           Appears normal,
left sided
Cerebellum:       Previously seen        Abdomen:           Appears normal
Posterior         Previously seen        Abdominal          Previously seen
Fossa:                                   Wall:
Nuchal Fold:      Previously seen        Cord Vessels:      Previously seen
Face:             Orbits and profile     Kidneys:           Appear normal
previously seen
Lips:             Previously seen        Bladder:           Appears normal
Palate:           Previously seen        Spine:             Previously seen
Heart:            Appears normal         Lower              Previously seen
(4CH, axis, and        Extremities:
situs)
RVOT:             Previously seen        Upper              Previously seen
Extremities:
LVOT:             Previously seen

Other:   Female gender previously seen. Heels and 5th digit previously
seen. Nasal bone visualized.
Cervix Uterus Adnexa

Cervical Length:    2.2       cm

Cervix:       Measured transvaginally.

Left Ovary:    Within normal limits.
Right Ovary:   Within normal limits.

Adnexa:     No abnormality visualized.
Impression

Single IUP at 23w 4d
Pre-existing diabetes - normal fetal echo
Normal interval anatomy
Fetal growth is appropriate (76th %tile)
Anterior placenta without previa

TVUS - cervical length 2.2 cm without funneling
Recommendations

Will begin vaginal progesterone  - prescription called in
Recommend follow up ultrasound for cervical length in 2
weeks
Ultrasound for growth in 4 weeks

questions or concerns.

## 2017-04-09 IMAGING — US US MFM OB TRANSVAGINAL
1 series · 13 of 26 positions shown · non-contrast
Comparison: none

[Series 1: us mfm ob transvaginal · 26 acquisitions, 13 frames shown]
[im 2/26]
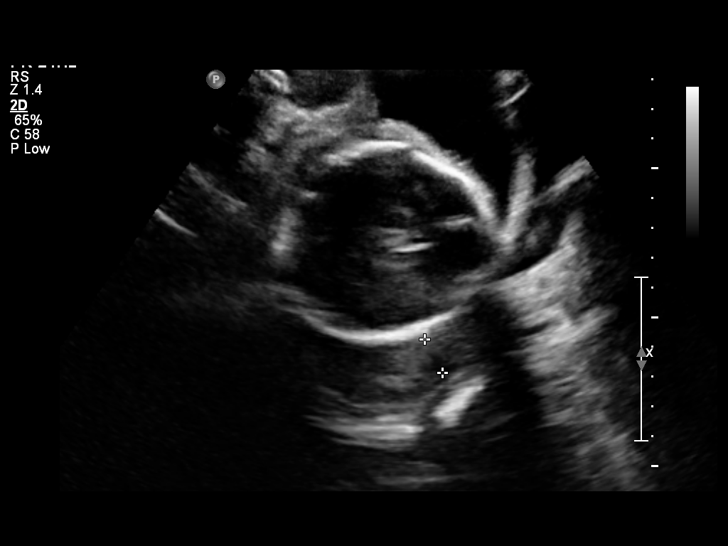
[im 4/26]
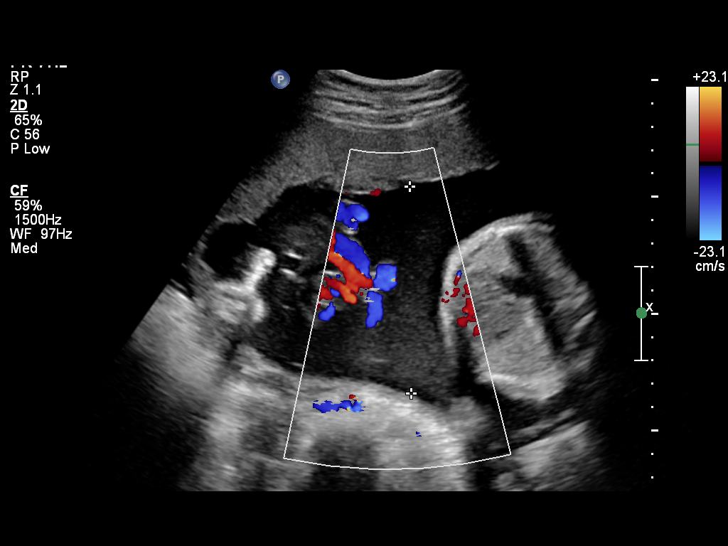
[im 6/26]
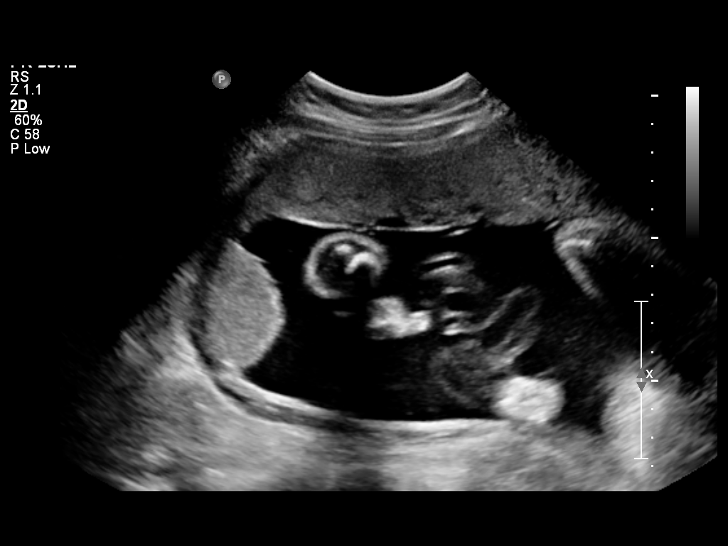
[im 8/26]
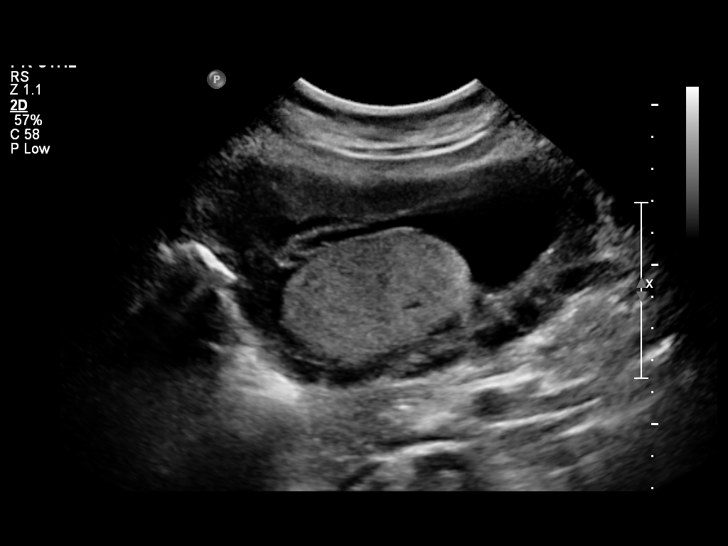
[im 10/26]
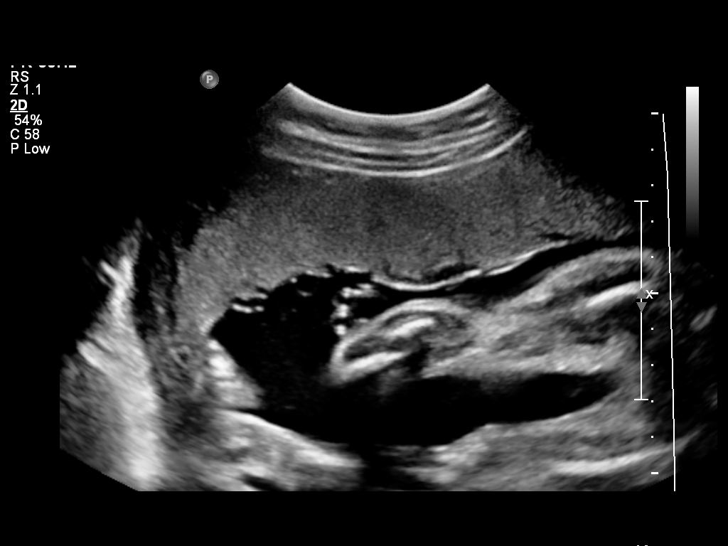
[im 12/26]
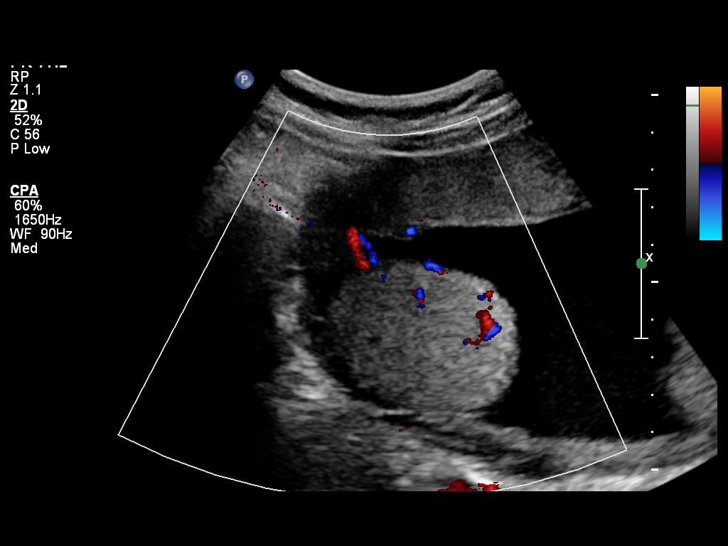
[im 14/26]
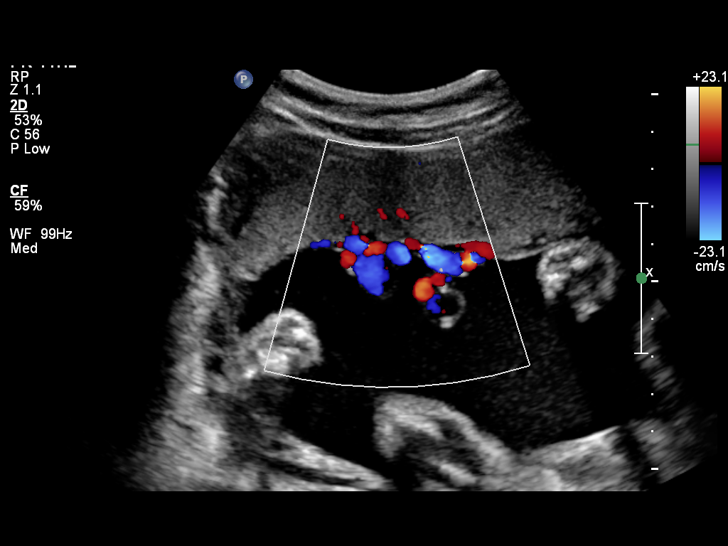
[im 16/26]
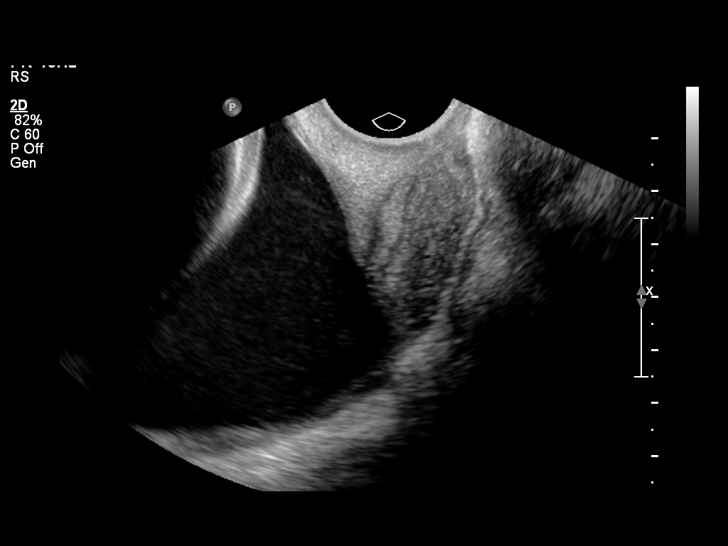
[im 18/26]
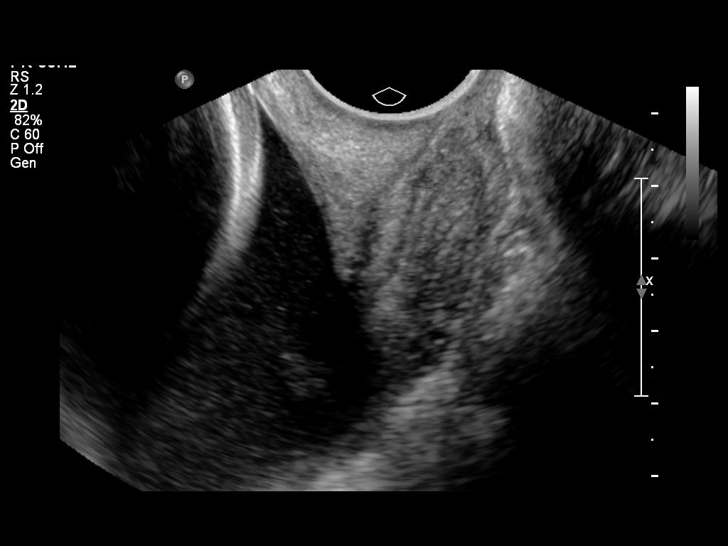
[im 20/26]
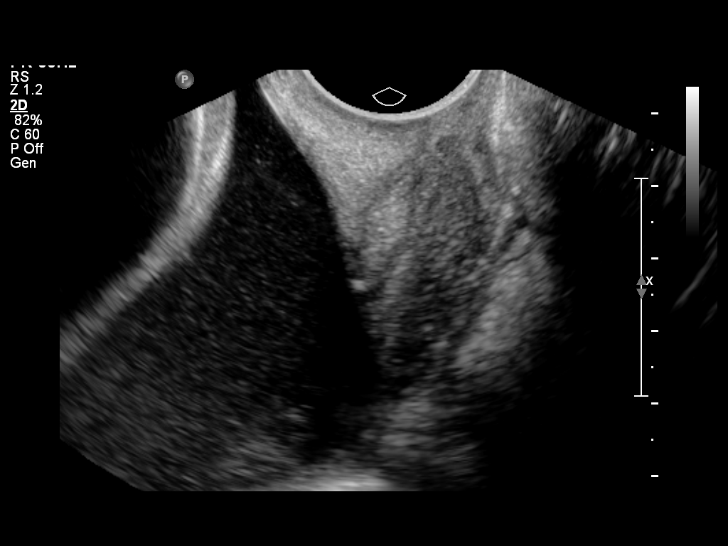
[im 22/26]
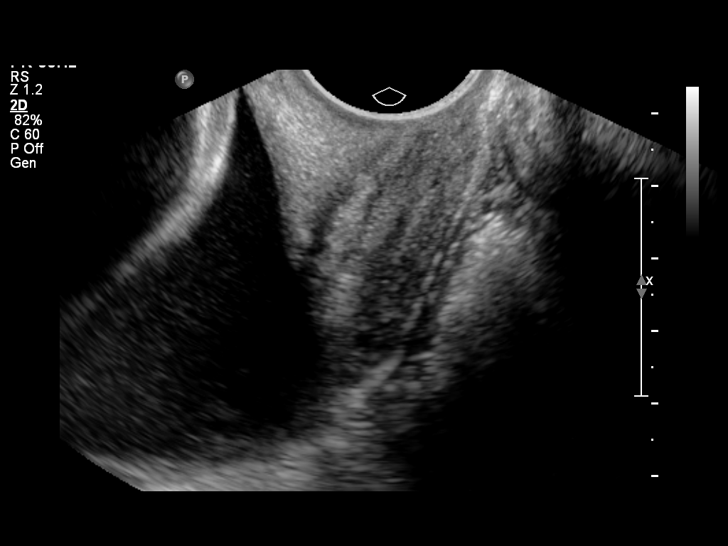
[im 24/26]
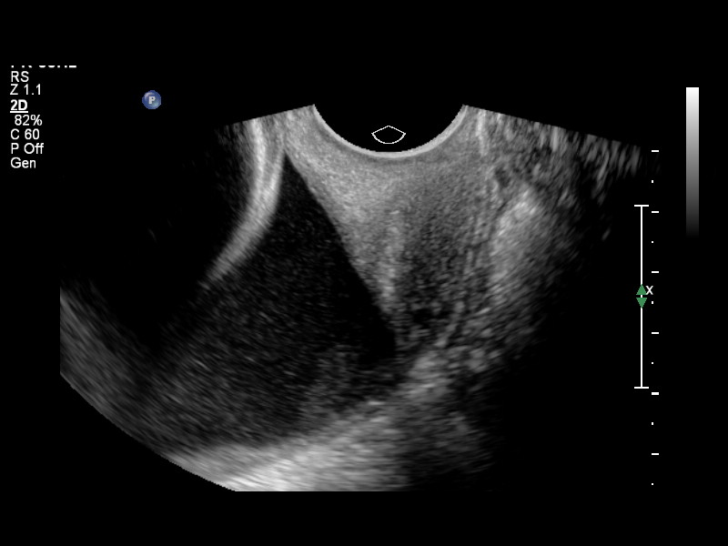
[im 26/26]
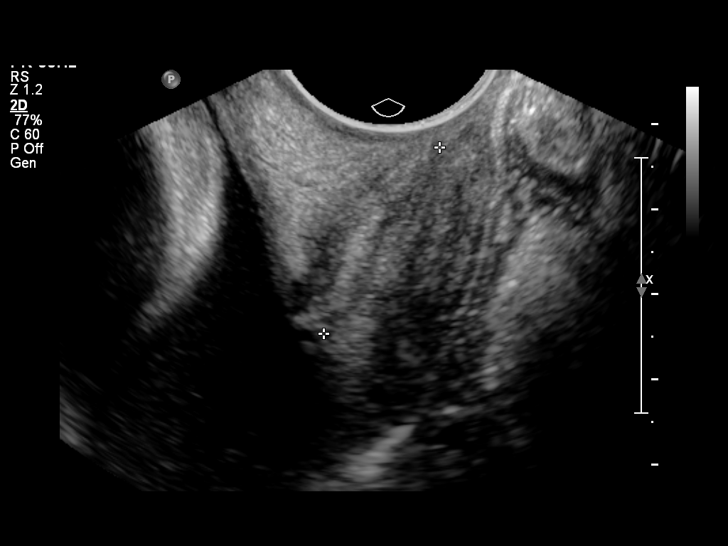

[13 of 26 positions shown; findings below may reference images not displayed]

OBSTETRICS REPORT
(Signed Final 09/06/2014 [DATE])

Name:       EHMADO DENETH                            Visit  09/06/2014 [DATE]
Date:

Service(s) Provided

US MFM OB TRANSVAGINAL                                 76817.2
Indications

25 weeks gestation of pregnancy
Cervical shortening, 2nd
Diabetes - Pregestational, 2nd trimester (on
metformin & glyburide; normal fetal ECHO)
History of genetic / anatomic abnormality - pt
with sickle cell trait
Fetal Evaluation

Num Of             1
Fetuses:
Fetal Heart        167                          bpm
Rate:
Cardiac Activity:  Observed
Presentation:      Cephalic
Placenta:          Anterior w/ post succ,
above cervical os
P. Cord            Visualized, central,
Insertion:         anterior

Amniotic Fluid
AFI FV:      Subjectively upper-normal
Larg Pckt:      8.8  cm
Gestational Age

LMP:           25w 6d        Date:  03/09/14                  EDD:   12/14/14
Best:          25w 6d    Det. By:   LMP  (03/09/14)           EDD:   12/14/14
Cervix Uterus Adnexa

Cervical Length:    2.2       cm

Cervix:       Measured transvaginally.
Impression

SIUP at 25+6 weeks
High normal amniotic fluid volume
EV views of cervix: shortened cervical length measuring
cms; no significant change from previous study two weeks
ago
Recommendations

Continue vaginal progesterone
Keep appt on [DATE] for CL and growth

questions or concerns.

## 2017-04-23 IMAGING — US US MFM FETAL BPP W/O NON-STRESS
1 series · 13 of 28 positions shown · non-contrast
Comparison: none

OBSTETRICS REPORT
(Signed Final 09/20/2014 [DATE])

Name:       ZAMIR AWAN                            Visit  09/20/2014 [DATE]
Date:
By:                                                      [facility]
Faculty Physician
Service(s) Provided
US MFM OB TRANSVAGINAL                                 76817.2
Indications
27 weeks gestation of pregnancy
Cervical shortening, 2nd
Diabetes - Pregestational, 2nd trimester (on
metformin & glyburide; normal fetal ECHO)
History of genetic / anatomic abnormality - pt
with sickle cell trait
Polyhydramnios, second trimester, antepartum
condition or complication, fetus 1
Fetal Evaluation
Num Of             1
Fetuses:
Fetal Heart        155                          bpm
Rate:
Cardiac Activity:  Observed
Presentation:      Cephalic
Placenta:          Anterior w/post succ,
above cervical os
Amniotic Fluid
AFI FV:      Polyhydramnios
Larg Pckt:      8.4  cm
Biophysical Evaluation
Amniotic F.V:   Within normal limits        F. Tone:        Observed
F. Movement:    Observed                    Score:          [DATE]
F. Breathing:   Observed
Biometry
BPD:     72.6   m    G. Age:   29w 1d                 CI:        79.92   70 - 86
m
FL/HC:      21.8   18.8 -
20.6
HC:     256.6   m    G. Age:   27w 6d        22  %    HC/AC:      0.94   1.05 -
AC:     273.6   m    G. Age:   31w 3d      > 97  %    FL/BPD      77.1   71 - 87
m                                     :
FL:        56   m    G. Age:   29w 4d        79  %    FL/AC:      20.5   20 - 24
HUM:       50   m    G. Age:   29w 2d        76  %
Est.        8475   gm    3 lb 6 oz      86   %
FW:
Gestational Age
LMP:           27w 6d        Date:  03/09/14                  EDD:   12/14/14
U/S Today:     29w 4d                                         EDD:   12/02/14
Best:          27w 6d    Det. By:   LMP  (03/09/14)           EDD:   12/14/14
Anatomy
Cranium:          Appears normal         Aortic Arch:       Appears normal
Fetal Cavum:      Appears normal         Ductal Arch:       Appears normal
Ventricles:       Appears normal         Diaphragm:         Appears normal
Choroid Plexus:   Previously seen        Stomach:           Appears normal,
left sided
Cerebellum:       Previously seen        Abdomen:           Appears normal
Posterior         Previously seen        Abdominal          Previously seen
Fossa:                                   Wall:
Nuchal Fold:      Previously seen        Cord Vessels:      Previously seen
Face:             Orbits and profile     Kidneys:           Appear normal
previously seen
Lips:             Previously seen        Bladder:           Appears normal
Palate:           Previously seen        Spine:             Previously seen
Heart:            Appears normal         Lower              Previously seen
(4CH, axis, and        Extremities:
situs)
RVOT:             Appears normal         Upper              Previously seen
Extremities:
LVOT:             Previously seen
Other:   Female gender previously seen. Heels and 5th digit previously
seen. Nasal bone visualized.
Cervix Uterus Adnexa
Cervical Length:    1.8       cm
Cervix:       Normal appearance by transvaginal scan
Left Ovary:    Not visualized. No adnexal mass visualized.
Right Ovary:   Not visualized. No adnexal mass visualized.
Impression
INDICATION: 24 yr old [id number] at 21w4d with type II
diabetes and short cervix for fetal growth and cervical length.

[Series 1: us mfm fetal bpp w/o non-stress · 55 acquisitions, 13 frames shown]
[im 3/55]
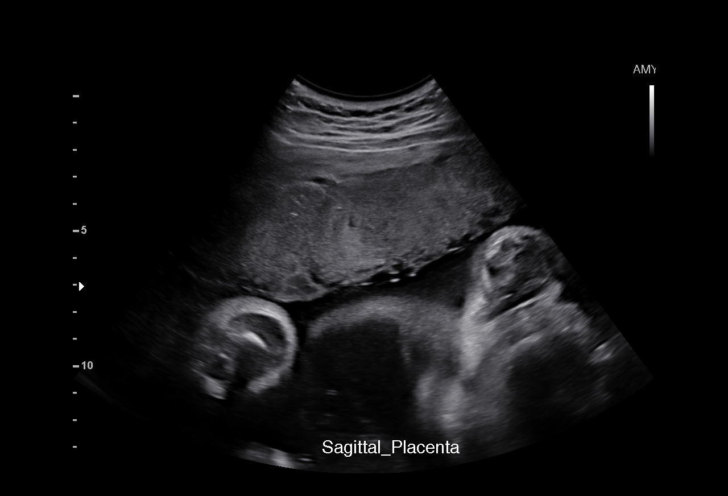
[im 7/55]
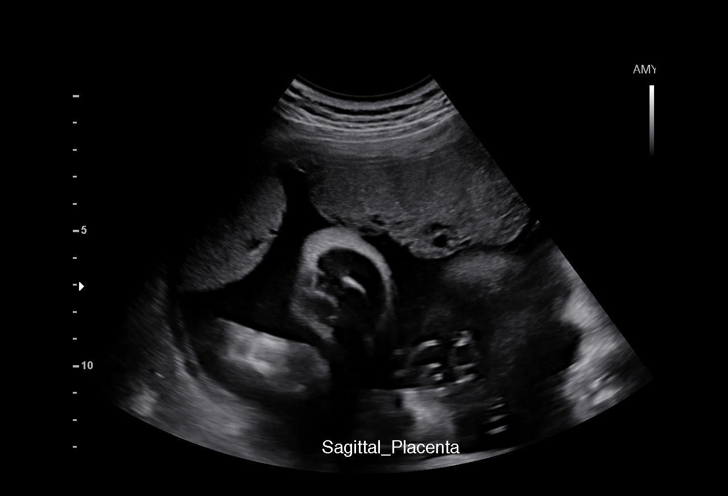
[im 11/55]
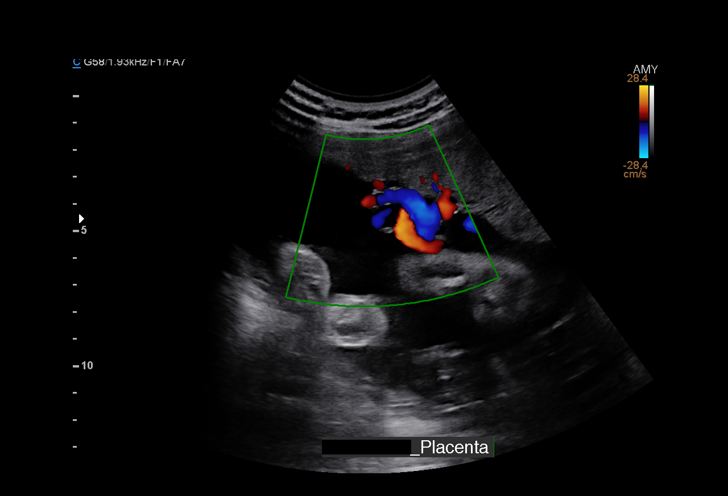
[im 15/55]
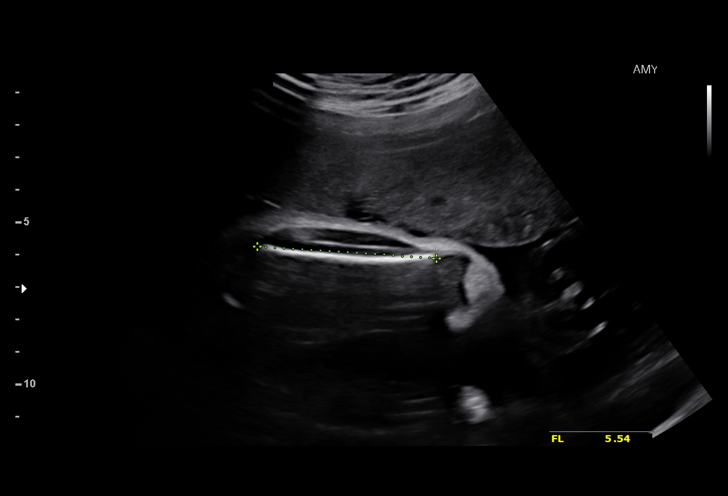
[im 19/55]
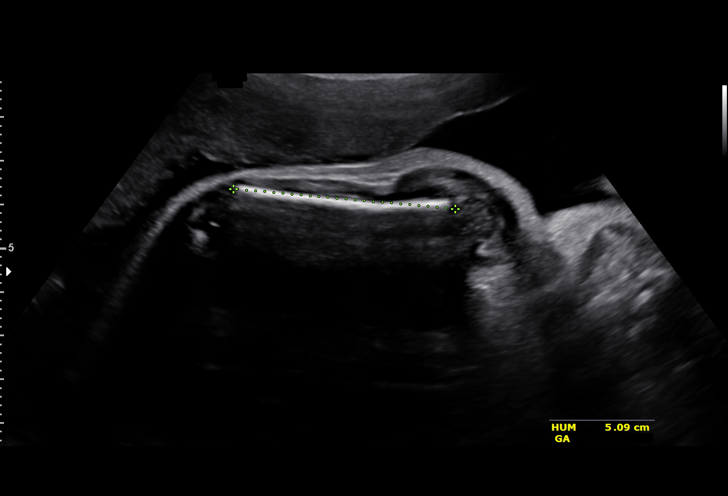
[im 23/55]
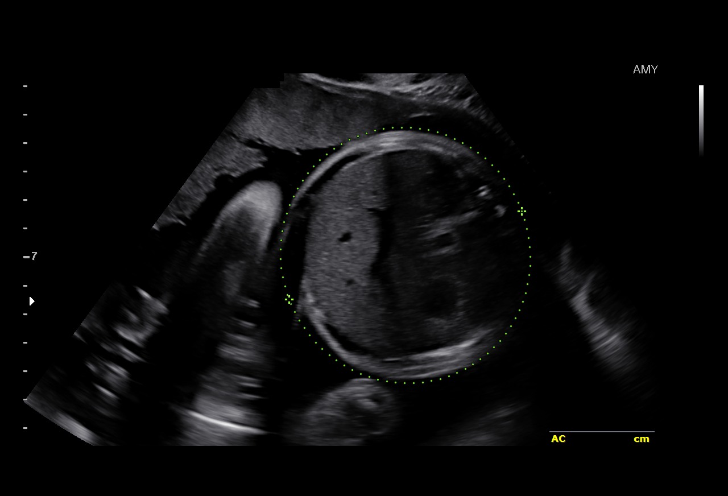
[im 29/55]
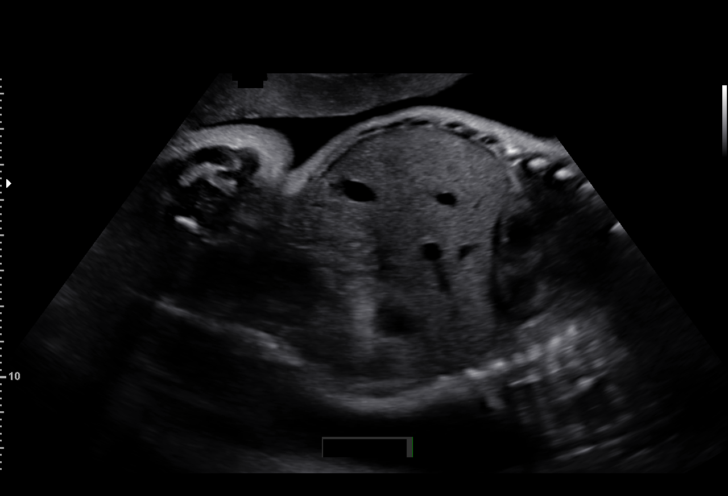
[im 33/55]
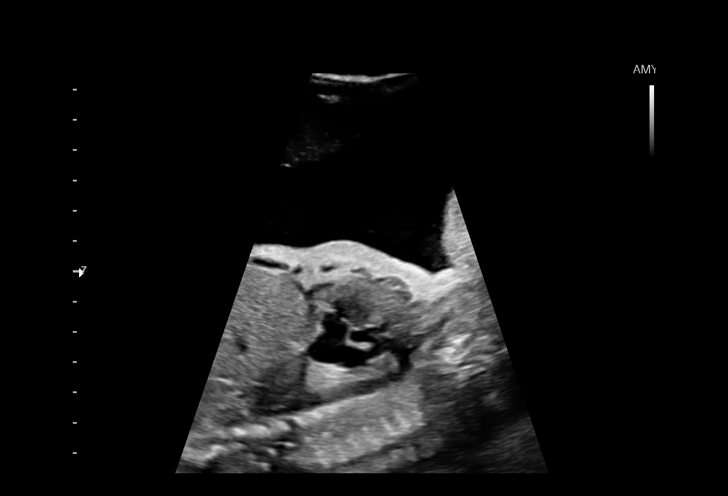
[im 37/55]
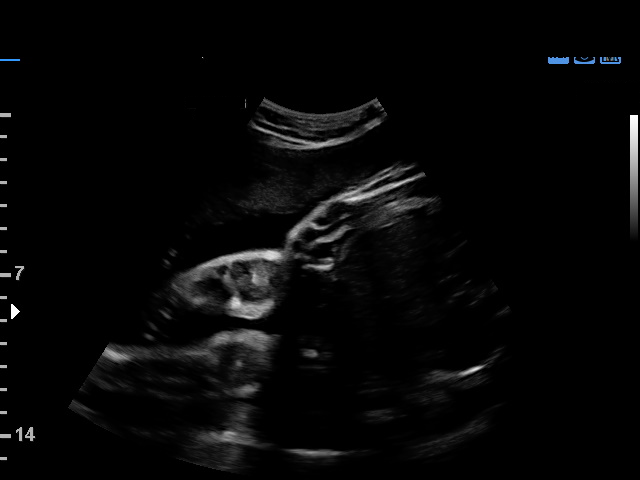
[im 41/55]
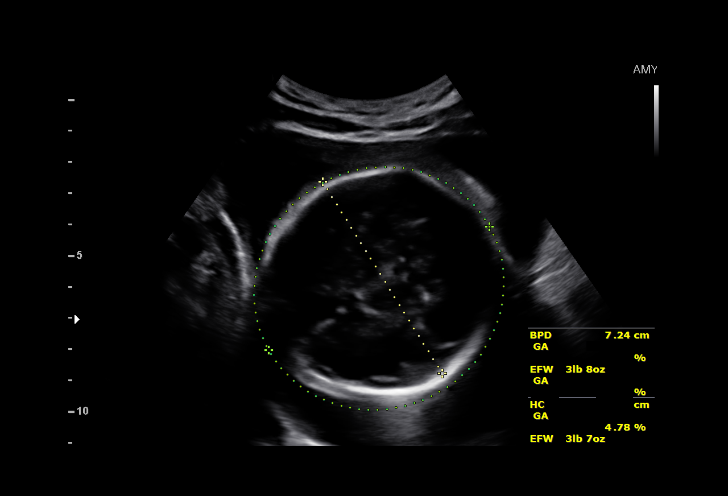
[im 45/55]
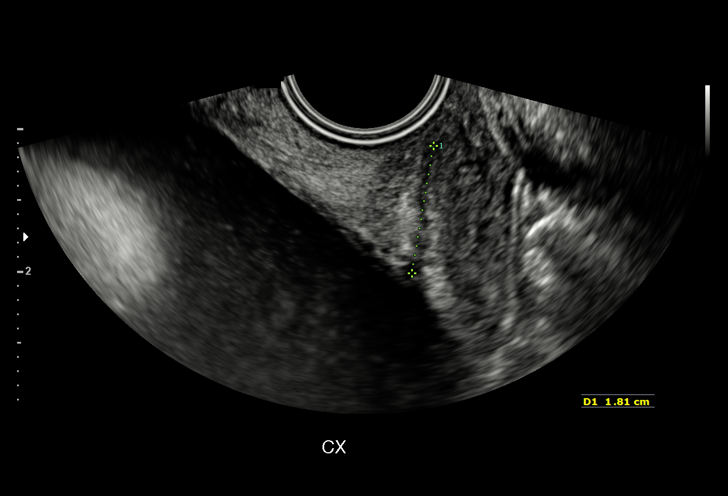
[im 49/55]
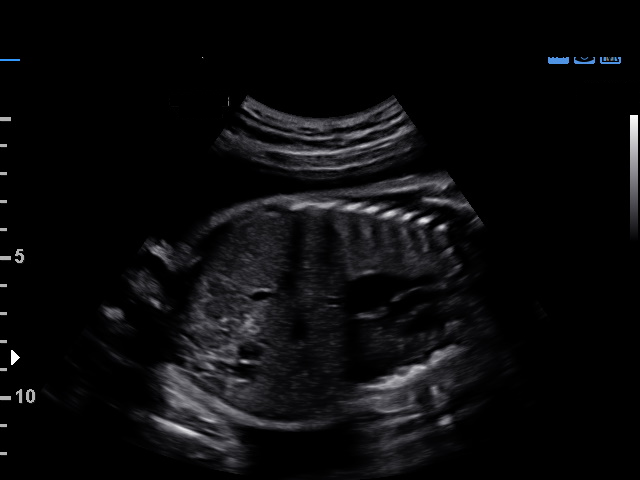
[im 53/55]
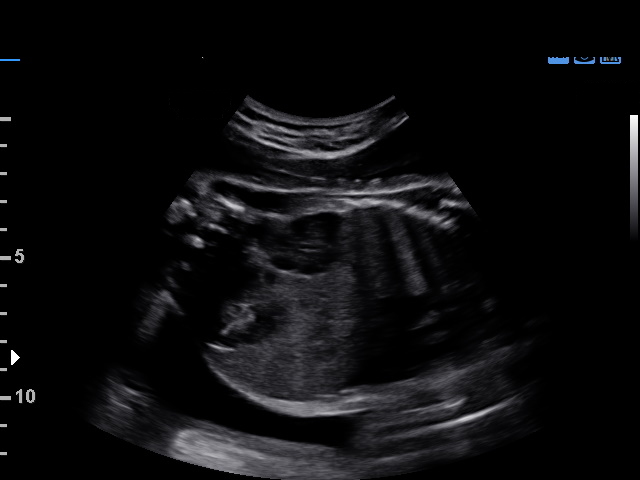

[13 of 28 positions shown; findings below may reference images not displayed]

FINDINGS: 1. Single intrauterine pregnancy.
2. Estimated fetal weight is in the 86th%.
3. Anterior placenta without evidence of previa.
4. Polyhydramnios with maximum vertical pocket of 8.4cm.
5. Shortened transvaginal cervical length of 1.8cm.
6. The limited anatomy survey is normal.
7. Normal biophysical profile of [DATE].
Recommendations

1. Appropriate fetal growth.
2. Diabetes:
- normal fetal echocardiogram
- recommend fetal growth every 4 weeks
- recommend start antenatal testing given polyhydramnios
- on metformin and glyburide
- recommend strict glucose control
3. Polyhydramnios:
- counseled that this is likely due to suboptimal diabetic
control
- recommend strict glucose control
- also discussed increased risk of PPROM, preterm
labor/delivery, and stillbirth
- recommend antenatal testing with weekly BPPs
- recommend fetal Castulo Waldrop
4. Short cervix:
- previously counseled
- given cervix is shorter on today's ultrasound recommend a
course of Seeta
- patient reports some contractions- sent to [facility] for
monitoring and cervical exam- if no cervical
change/contractions may receive steroids as an outpatient;
if any concern for preterm labor recommend tocolysis and
inpatient management
5. Normal first trimester screen and MSAFP

Patient sent to [facility]. Discussed with Dr. Yeni

questions or concerns.

## 2019-03-18 ENCOUNTER — Ambulatory Visit (INDEPENDENT_AMBULATORY_CARE_PROVIDER_SITE_OTHER): Payer: 59 | Admitting: INTERNAL MEDICINE-ENDOCRINOLOGY-DIABETES AND METABOLISM

## 2019-03-18 ENCOUNTER — Encounter (INDEPENDENT_AMBULATORY_CARE_PROVIDER_SITE_OTHER): Payer: Self-pay | Admitting: INTERNAL MEDICINE-ENDOCRINOLOGY-DIABETES AND METABOLISM

## 2019-03-18 ENCOUNTER — Other Ambulatory Visit: Payer: Self-pay

## 2019-03-18 VITALS — BP 114/64 | HR 101 | Resp 16 | Ht 61.0 in | Wt 123.0 lb

## 2019-03-18 DIAGNOSIS — Z79899 Other long term (current) drug therapy: Secondary | ICD-10-CM

## 2019-03-18 DIAGNOSIS — E1165 Type 2 diabetes mellitus with hyperglycemia: Secondary | ICD-10-CM

## 2019-03-18 DIAGNOSIS — I10 Essential (primary) hypertension: Secondary | ICD-10-CM

## 2019-03-18 DIAGNOSIS — Z794 Long term (current) use of insulin: Secondary | ICD-10-CM

## 2019-03-18 DIAGNOSIS — Z6823 Body mass index (BMI) 23.0-23.9, adult: Secondary | ICD-10-CM

## 2019-03-18 LAB — LIPID PANEL
CAD RISK: 50
CHOLESTEROL: 171 mg/dL (ref <=199)
HDL-CHOLESTEROL: 86 mg/dL
LDL (CALCULATED): 77 mg/dL
NON - HDL (CALCULATED): 85 mg/dL
TRIGLYCERIDES: 41 mg/dL
VLDL (CALCULATED): 8 mg/dL

## 2019-03-18 LAB — VITAMIN D 25 HYDROXY: VITAMIN D 25 HYDROXY: 29 ng/mL — ABNORMAL LOW (ref 30–100)

## 2019-03-18 LAB — THYROID STIMULATING HORMONE (SENSITIVE TSH): TSH: 0.53 mcIU/mL (ref 0.450–5.330)

## 2019-03-18 MED ORDER — TRULICITY 0.75 MG/0.5 ML SUBCUTANEOUS PEN INJECTOR
0.7500 mg | PEN_INJECTOR | SUBCUTANEOUS | 4 refills | Status: DC
Start: 2019-03-18 — End: 2019-04-15

## 2019-03-18 NOTE — Patient Instructions (Addendum)
Continue Lantus 10 units daily. If your fasting number is less than 80 persistently after starting Trulicity, then you can cut down the Lantus to 7 or 8 units.     Start Trulicity 0.75 mg weekly.       Start checking your sugars 4 times a day, before breakfast, lunch and dinner.

## 2019-03-18 NOTE — Progress Notes (Signed)
[facility] MEDICINE & SPECIALTY  ENDOCRINOLOGY, MEDICAL STAFF OFFICE BLDG  [address]  [place] [address]      Clinical Care Team:  -Referring Provider for today's visit: Robin Searing, FNP  -Primary Care Provider: Tyson Babinski, MD    chief complaint: T2DM    Ann Chandler is a 29 y.o. female with PMH of T2DM, Vit D deficiency. The initial diagnosis of diabetes was made in 2016 by her PCP.   She was on metformin briefly. She was having side effects from it and stopped it. She got pregnant in 2016, and was started on metformin again. She took it for 1st and 2nd trimester. Tradjenta was then added. For last 2 months of pregnancy she was on insulin. [place].     She was again put on metformin from 2018 to 2019, then XR metformin, which she did not tolerate either. She tried Trulicity for 2 weeks in 2019, and it worked to improve her BG. She did tolerate it. She was on trasjenta-glipizide in 2020 but had headaches from it. She was started on Lantus 10 units in 01/2019. Her BG were initially within range on insulin, but recently in high 100s.     Her home regimen is:               Basal: Lantus 10 units Qhs              Prandial:               Correction:               Non-insulin meds: none              Location of insulin injections: abdomen, rotates sites    Home blood glucose log:  Meter: truemetrix  She did bring in meter or glucose log to review.   She does check her blood sugars regularly.  Breakfast: 140-180  Lunch:   Dinner:   Bedtime:     Diet: She is not following a diabetes friendly diet. She has cut down on bread. Does not drink soda, sweet teas.   Hypoglycemia: she reports no hypoglycemic events.   Frequency and timing of hypoglycemia: n/a  Severity of hypoglycemic episode:n/a  Hypoglycemic threshold: <80  Patient is aware of hypoglycemic symptoms including weakness, shakiness, sweating, palpitations.    Education: She has had diabetes education.  Physical activity:  none    Complications: She has known complications  Neuropathy: none  Nephropathy: none  Retinopathy: unknown. Her last dilated eye exam was n/a.   Dental: uptodate  CAD: none  CVA: none  PVD: none  History of recent hospitalization: none   Last episode of DKA or hyperosmolar coma: none    H/o frequent UTIs/yeast infections: used to have yeast infections once a month or every other month. Last episode was 06/2018.   H/o pancreatitis or gallstones: none  FHx of pancreatic cancer or thyroid cancer: thinks her dad had pancreatitis     Family history:  CAD: none  Diabetes: both parents, sister    __________________________________________________________________________  Current Outpatient Medications   Medication Sig Dispense Refill   . dulaglutide (TRULICITY) 0.75 mg/0.5 mL Subcutaneous Pen Injector 0.5 mL (0.75 mg total) by Subcutaneous route Every 7 days 2 mL 4   . ergocalciferol, vitamin D2, (DRISDOL) 1,250 mcg (50,000 unit) Oral Capsule TAKE ONE CAPSULE BY MOUTH ONCE A WEEK     . LANTUS SOLOSTAR U-100  INSULIN 100 unit/mL (3 mL) Subcutaneous Insulin Pen Inject 10 units every day under the skin at bedtime.     . TECHLITE PEN NEEDLE 31 gauge x 5/16" Does not apply Needle use to inject insulin daily     . TRUE METRIX GLUCOSE TEST STRIP Does not apply Strip USE TO test blood sugar levels EVERY DAY     . valACYclovir (VALTREX) 500 mg Oral Tablet TAKE ONE TABLET BY MOUTH EVERY DAY       No current facility-administered medications for this visit.       Past Medical History:   Diagnosis Date   . Allergic rhinitis    . Diabetes mellitus, type 2 (CMS HCC)        Social History     Socioeconomic History   . Marital status: Single     Spouse name: Not on file   . Number of children: Not on file   . Years of education: Not on file   . Highest education level: Not on file   Occupational History   . Not on file   Tobacco Use   . Smoking status: Never Smoker   . Smokeless tobacco: Never Used   Vaping Use   . Vaping Use: Never  used   Substance and Sexual Activity   . Alcohol use: No   . Drug use: No   . Sexual activity: Not on file   Other Topics Concern   . Not on file   Social History Narrative   . Not on file     Social Determinants of Health     Financial Resource Strain:    . Difficulty of Paying Living Expenses:    Food Insecurity:    . Worried About Programme researcher, broadcasting/film/video in the Last Year:    . Barista in the Last Year:    Transportation Needs:    . Freight forwarder (Medical):    Marland Kitchen Lack of Transportation (Non-Medical):    Physical Activity:    . Days of Exercise per Week:    . Minutes of Exercise per Session:    Stress:    . Feeling of Stress :    Intimate Partner Violence:    . Fear of Current or Ex-Partner:    . Emotionally Abused:    Marland Kitchen Physically Abused:    . Sexually Abused:      Family Medical History:     Problem Relation (Age of Onset)    Cancer Mother    Diabetes Mother    Hypertension (High Blood Pressure) Mother            @ALLERGYP @  _____________________________________________________________________________    Review of Systems -  Positives are marked with bold letters.  Constitutional: fatigue, confusion, changes in mental status    Skin: lesions, rashes, sores, discoloration.  Eyes: double vision, blurring, tearing, loss of vision  Cardiovascular: palpitations, chest pain, claudication.  Respiratory: dyspnea, DOE, Cough.  Gastrointestinal: nausea, vomiting, diarrhea, constipation, abdominal pain.  Genitourinary: bladder incontinence, dysuria, hematuria.   Musculoskeletal: myalgias, joint pain, decreased range of motion.  Neurological: dizziness, tingling, neuropathic pain, headaches.   Endocrine: heat or cold intolerance, polyphagia, polydipsia, polyuria, nocturia.  Psychiatric: mood changes, depression   All other systems negative.    Physical Exam:  Vitals: BP 114/64   Pulse (!) 101   Resp 16   Ht 1.549 m (5\' 1" )   Wt 55.8 kg (123 lb)   SpO2 98%   BMI  23.24 kg/m       Wt Readings from Last 3  Encounters:   03/18/19 55.8 kg (123 lb)     Constitutional: She is well-developed, well-nourished, and in no distress.   Head: Normocephalic. EOMI  Neck: Normal range of motion. Neck supple. No thyromegaly present.   Cardiovascular: Normal rate, regular rhythm, normal heart sounds and intact distal pulses. Exam reveals no murmur, gallop, or friction rub.   Pulmonary/Chest: Effort normal and breath sounds normal. No respiratory distress. She has no wheezes. She has no rales.   Abdominal: Soft. Normal appearance No tenderness. No hernia.   Musculoskeletal: Normal range of motion. She exhibits no edema.   Neurological: She is alert. She is not disoriented.   Skin: Skin is warm. No rash noted. No erythema. No pallor.   Feet no edema    Labs         _____________________________________________________________________________    Assessment / Impression & Plan:  Impression: Ann Chandler is a 29 y.o. female with history of T2DM, Vit D deficiency who presents for diabetes management.     Type 2 DM uncontrolled, A1c 9.3%     1. Diabetes medication changes:   -Start Trulicity 2.37 mg weekly. Gave her some samples in clinic  -Continue Lantus 10 units daily. Advised her to cut down insulin by 2-3 units if her BG<80 on multiple occassions  -Start checking BG 4 times a day, maintain a log and bring it to next visit. She is interested in [employer]  -She wants to have another kid. I advised her that A1C<7% before she can plan pregnancy   -Will check Abs to rule out T1DM.     2. Education addressed during this appointment today:  blood sugar goals, insulin adjustments, SMBG skills, nutrition and site rotation    3. Hypertension: Yes. Blood pressure is under good control. I recommend monitoring off meds.    4. Kidney function: Creatinine done within the past year and normal. Labs: No results found for: CREATURINE.    5. Diabetic neuropathy- The patient No evidence of peripheral neuropathy with decreased sensation to monofilament and  decreased vibratory sensation in addition to symptoms consist with numbness and tingling in her lower extremity. Recommend to continue with regular podiatry check-up and prevention of diabetes complication.    6. Hyperlipidemia: unknown. Her LDL goal is < 100 mg/dl (CHD or "CHD risk equivalent" is present). LDL is under unknown control. she is currently not taking a statin.  Age <40, 10 year ASCVD risk <20%  (statin not recommended).     7.Anti-platelet therapy - ADA recommends aspirin therapy (75-162 mg/day) as a primary prevention strategy in those with diabetes who are at increased cardiovascular risk (10-year risk > 10%). This includes most men or women with diabetes aged > 27 years who have at least one additional major risk factor (family history of premature atherosclerotic cardiovascular disease (<84 years of age in a first-degree female relative or <48 years of age in a first-degree female relative), hypertension, smoking, dyslipidemia, or albuminuria) and are not at increased risk of bleeding. Patient currently takes: does not take ASA     8. Eye Examinaton: The patient is overdue for an eye examination and will make an appointment in the near future.    Orders Placed This Encounter   . GAD65 AB ASSAY, S   . ISLET ANTIGEN 2 (IA-2) ANTIBODY, SERUM   . ZINC TRANSPORTER ANTIBODY - HIE ONLY   . INSULIN ANTIBODIES, SERUM   .  ISLET ANTIGEN 2 (IA-2) ANTIBODY, SERUM   . LIPID PANEL   . THYROID STIMULATING HORMONE (SENSITIVE TSH)   . VITAMIN D 25 HYDROXY   . dulaglutide (TRULICITY) 0.75 mg/0.5 mL Subcutaneous Pen Injector       RTC in 4 weeks.      Darden Palmer, MD  Endocrinology  [address] [place]  [address]  [phone] (office)  [phone] (fax)  [email or web address]  Pager- [phone]

## 2019-03-18 NOTE — Progress Notes (Deleted)
[facility]  ENDOCRINOLOGY, MEDICAL STAFF OFFICE BLDG  [address]  [place] [address]      Clinical Care Team:  -Referring Provider for today's visit: Ethelene Hal, [place]  -Primary Care Provider: Jarvis Newcomer, MD    chief complaint: T2DM    Ann Chandler is a 29 y.o. female .  The initial diagnosis of diabetes was made in 2016 by her PCP.   She was on metformin briefly. She was having side effects from it and stopped it. She got pregnant in 2016, and was started on metformin again. She took it for 1st and 2nd trimester. Tradjenta was then added. For last 2 months of pregnancy she was on insulin. [place].     She was again put on metformin from 2018 to 2019, then XR metformin, which she did not tolerate either. She tried Trulicity for 2 weeks in 2019, and it worked to improve her BG. She did tolerate it. She was on trasjenta-glipizide in 2020 but had headaches from it. She was started on Lantus 10 units in 01/2019. Her BG were initially within range on insulin, but recently in high 100s.     Her home regimen is:    Basal: Lantus 10 units Qhs   Prandial:    Correction:    Non-insulin meds: none              Location of insulin injections: abdomen, rotates sites    Home blood glucose log:  Meter: truemetrix  She did bring in meter or glucose log to review.   She does check her blood sugars regularly.  Breakfast: 130-180  Lunch:   Dinner:   Bedtime:     Diet: She is not following a diabetes friendly diet. She has cut down on bread. Does not drink soda, sweet teas.   Hypoglycemia: she reports no hypoglycemic events.   Frequency and timing of hypoglycemia: n/a  Severity of hypoglycemic episode:n/a  Hypoglycemic threshold: <80  Patient is aware of hypoglycemic symptoms including weakness, shakiness, sweating, palpitations.    Education: She has had diabetes education.  Physical activity: none    Complications: She has known complications  Neuropathy: none  Nephropathy:  none  Retinopathy: unknown. Her last dilated eye exam was n/a.   Dental: uptodate  CAD: none  CVA: none  PVD: none  History of recent hospitalization: none   Last episode of DKA or hyperosmolar coma: none    H/o frequent UTIs/yeast infections: used to have yeast infections once a month or every other month. Last episode was 06/2018.   H/o pancreatitis or gallstones: none  FHx of pancreatic cancer or thyroid cancer: thinks her dad had pancreatitis     Family history:  CAD: none  Diabetes: both parents, sister  _____________________________________________________________________________  Current Outpatient Medications   Medication Sig Dispense Refill   . ergocalciferol, vitamin D2, (DRISDOL) 1,250 mcg (50,000 unit) Oral Capsule TAKE ONE CAPSULE BY MOUTH ONCE A WEEK     . LANTUS SOLOSTAR U-100 INSULIN 100 unit/mL (3 mL) Subcutaneous Insulin Pen Inject 10 units every day under the skin at bedtime.     . TECHLITE PEN NEEDLE 31 gauge x 5/16" Does not apply Needle use to inject insulin daily     . TRUE METRIX GLUCOSE TEST STRIP Does not apply Strip USE TO test blood sugar levels EVERY DAY     . valACYclovir (VALTREX) 500 mg Oral Tablet TAKE ONE TABLET BY  MOUTH EVERY DAY       No current facility-administered medications for this visit.       Past Medical History:   Diagnosis Date   . Allergic rhinitis    . Diabetes mellitus, type 2 (CMS HCC)          Social History     Socioeconomic History   . Marital status: Single     Spouse name: Not on file   . Number of children: Not on file   . Years of education: Not on file   . Highest education level: Not on file   Occupational History   . Not on file   Tobacco Use   . Smoking status: Never Smoker   . Smokeless tobacco: Never Used   Vaping Use   . Vaping Use: Never used   Substance and Sexual Activity   . Alcohol use: No   . Drug use: No   . Sexual activity: Not on file   Other Topics Concern   . Not on file   Social History Narrative   . Not on file     Social Determinants of  Health     Financial Resource Strain:    . Difficulty of Paying Living Expenses:    Food Insecurity:    . Worried About Programme researcher, broadcasting/film/video in the Last Year:    . Barista in the Last Year:    Transportation Needs:    . Freight forwarder (Medical):    Marland Kitchen Lack of Transportation (Non-Medical):    Physical Activity:    . Days of Exercise per Week:    . Minutes of Exercise per Session:    Stress:    . Feeling of Stress :    Intimate Partner Violence:    . Fear of Current or Ex-Partner:    . Emotionally Abused:    Marland Kitchen Physically Abused:    . Sexually Abused:      Family Medical History:     Problem Relation (Age of Onset)    Cancer Mother    Diabetes Mother    Hypertension (High Blood Pressure) Mother            @ALLERGYP @  _____________________________________________________________________________    Review of Systems -  Positives are marked with bold letters.  Constitutional: fatigue, confusion, changes in mental status    Skin: lesions, rashes, sores, discoloration.  Eyes: double vision, blurring, tearing, loss of vision  Cardiovascular: palpitations, chest pain, claudication.  Respiratory: dyspnea, DOE, Cough.  Gastrointestinal: nausea, vomiting, diarrhea, constipation, abdominal pain.  Genitourinary: bladder incontinence, dysuria, hematuria.   Musculoskeletal: myalgias, joint pain, decreased range of motion.  Neurological: dizziness, tingling, neuropathic pain, headaches.   Endocrine: heat or cold intolerance, polyphagia, polydipsia, polyuria, nocturia.  Psychiatric: mood changes, depression   All other systems negative.    Physical Exam:  Vitals: BP 114/64   Pulse (!) 101   Resp 16   Ht 1.549 m (5\' 1" )   Wt 55.8 kg (123 lb)   SpO2 98%   BMI 23.24 kg/m       Wt Readings from Last 3 Encounters:   03/18/19 55.8 kg (123 lb)     Constitutional: She is well-developed, well-nourished, and in no distress.   Head: Normocephalic. EOMI  Neck: Normal range of motion. Neck supple. No thyromegaly present.      Cardiovascular: Normal rate, regular rhythm, normal heart sounds and intact distal pulses. Exam reveals no murmur, gallop, or friction  rub.   Pulmonary/Chest: Effort normal and breath sounds normal. No respiratory distress. She has no wheezes. She has no rales.   Abdominal: Soft. Normal appearance and bowel sounds are normal. There is no organomegaly or hepatomegaly. No tenderness. . No hernia.   Musculoskeletal: Normal range of motion. She exhibits no edema.   Neurological: She is alert. She is not disoriented.   Skin: Skin is warm. No rash noted. No erythema. No pallor.   Feet Dorsalis Pedis: Palpable.    Labs     _____________________________________________________________________________    Assessment / Impression & Plan:  Impression: Ann Chandler is a 29 y.o. female with history of *** who presents for diabetes management.     {diabetestypes:30560} controlled, A1c     1. Diabetes medication changes: {none:33079}     2. Education addressed during this appointment today:  {Diabetes education MVEH:20947}    3. Hypertension: {YES/NO:19957}. Blood pressure is under {good/fair/poor:33178} control. I recommend ***.    4. Kidney function: {DM renal plan:10231}. Labs: No results found for: CREATURINE.    5. Diabetic neuropathy- The patient {has/does not have:25136} evidence of peripheral neuropathy with decreased sensation to monofilament and decreased vibratory sensation in addition to symptoms consist with numbness and tingling in her lower extremity. Recommend to continue with regular podiatry check-up and prevention of diabetes complication.    6. Hyperlipidemia: {YES/NO:19957}. Her LDL goal is {Ldl target:16483}. LDL is under {good/fair/poor:33178} control. she is currently taking:{choose a statin:28402}  {statinage:30561}.     7.Anti-platelet therapy - ADA recommends aspirin therapy (75-162 mg/day) as a primary prevention strategy in those with diabetes who are at increased cardiovascular risk (10-year risk >  10%). This includes most men or women with diabetes aged > 50 years who have at least one additional major risk factor (family history of premature atherosclerotic cardiovascular disease (<28 years of age in a first-degree female relative or <72 years of age in a first-degree female relative), hypertension, smoking, dyslipidemia, or albuminuria) and are not at increased risk of bleeding. Patient currently takes: {ASA:30646}    8. Eye Examinaton: {DM eye plan:10230}.      RTC in *** weeks.      Darden Palmer, MD  Endocrinology  [address] [place]  [address]  [phone] (office)  [phone] (fax)  [email or web address]  Pager- [phone]

## 2019-03-22 ENCOUNTER — Telehealth (INDEPENDENT_AMBULATORY_CARE_PROVIDER_SITE_OTHER): Payer: Self-pay | Admitting: INTERNAL MEDICINE-ENDOCRINOLOGY-DIABETES AND METABOLISM

## 2019-03-22 NOTE — Telephone Encounter (Signed)
Please call patient and tell her to start taking vitamin D 2000 international units daily. She can get this over the counter. Thanks. All other labs looked good.       Darden Palmer, MD

## 2019-03-24 ENCOUNTER — Other Ambulatory Visit (INDEPENDENT_AMBULATORY_CARE_PROVIDER_SITE_OTHER): Payer: Self-pay | Admitting: INTERNAL MEDICINE-ENDOCRINOLOGY-DIABETES AND METABOLISM

## 2019-03-24 LAB — ISLET ANTIGEN 2 (IA-2) ANTIBODY, SERUM: ISLET ANTIGEN 2 (IA-2) ANTIBODY, SERUM: 0 nmol/L (ref <=0.02)

## 2019-03-24 MED ORDER — TRUE METRIX GLUCOSE TEST STRIP
ORAL_STRIP | 3 refills | Status: AC
Start: 2019-03-24 — End: ?

## 2019-03-26 LAB — GAD65 AB ASSAY, S
GAD65 AB ASSAY: 0.03 nmol/L — ABNORMAL HIGH (ref <=0.02)
INSULIN ANTIBODIES (IAA) - REFERENCE LAB: 0.32 nmol/L — ABNORMAL HIGH (ref 0.00–0.02)

## 2019-04-15 ENCOUNTER — Encounter (INDEPENDENT_AMBULATORY_CARE_PROVIDER_SITE_OTHER): Payer: Self-pay | Admitting: INTERNAL MEDICINE-ENDOCRINOLOGY-DIABETES AND METABOLISM

## 2019-04-15 ENCOUNTER — Other Ambulatory Visit: Payer: Self-pay

## 2019-04-15 ENCOUNTER — Other Ambulatory Visit (INDEPENDENT_AMBULATORY_CARE_PROVIDER_SITE_OTHER): Payer: Self-pay | Admitting: INTERNAL MEDICINE-ENDOCRINOLOGY-DIABETES AND METABOLISM

## 2019-04-15 ENCOUNTER — Ambulatory Visit (INDEPENDENT_AMBULATORY_CARE_PROVIDER_SITE_OTHER): Payer: 59 | Admitting: INTERNAL MEDICINE-ENDOCRINOLOGY-DIABETES AND METABOLISM

## 2019-04-15 VITALS — BP 116/60 | HR 92 | Ht 61.0 in | Wt 118.0 lb

## 2019-04-15 DIAGNOSIS — E119 Type 2 diabetes mellitus without complications: Secondary | ICD-10-CM

## 2019-04-15 DIAGNOSIS — Z6822 Body mass index (BMI) 22.0-22.9, adult: Secondary | ICD-10-CM

## 2019-04-15 DIAGNOSIS — I10 Essential (primary) hypertension: Secondary | ICD-10-CM

## 2019-04-15 DIAGNOSIS — E1165 Type 2 diabetes mellitus with hyperglycemia: Secondary | ICD-10-CM

## 2019-04-15 DIAGNOSIS — Z79899 Other long term (current) drug therapy: Secondary | ICD-10-CM

## 2019-04-15 LAB — PC POCT GLUCOSE FINGERSTICK - HIE ONLY: GLUCOSE POC - HIE ONLY: 125

## 2019-04-15 LAB — GLUCOSE, NON FASTING: GLUCOSE: 106 mg/dL (ref 74–106)

## 2019-04-15 MED ORDER — TRULICITY 1.5 MG/0.5 ML SUBCUTANEOUS PEN INJECTOR
1.5000 mg | PEN_INJECTOR | SUBCUTANEOUS | 3 refills | Status: DC
Start: 2019-04-15 — End: 2019-07-22

## 2019-04-15 NOTE — Progress Notes (Signed)
[facility] MEDICINE & SPECIALTY  ENDOCRINOLOGY, MEDICAL STAFF OFFICE BLDG  [address]  [place] [address]      Clinical Care Team:  -Referring Provider for today's visit: Robin Searing, FNP  -Primary Care Provider: Tyson Babinski, MD    chief complaint: T2DM    Ann Chandler is a 29 y.o. female with PMH of T2DM, Vit D deficiency. The initial diagnosis of diabetes was made in 2016 by her PCP.   She was on metformin briefly. She was having side effects from it and stopped it. She got pregnant in 2016, and was started on metformin again. She took it for 1st and 2nd trimester. Tradjenta was then added. For last 2 months of pregnancy she was on insulin. [place].     She was again put on metformin from 2018 to 2019, then XR metformin, which she did not tolerate either. She tried Trulicity for 2 weeks in 2019, and it worked to improve her BG. She did tolerate it. She was on tradjenta-glipizide in 2020 but had headaches from it. She was started on Lantus 10 units in 01/2019. Her BG were initially within range on insulin, but recently in high 100s.     Her home regimen is:               Basal: Lantus 10 units Qhs              Prandial:               Correction:               Non-insulin meds: Trulicity 0.75 mg weekly              Location of insulin injections: abdomen, rotates sites    Home blood glucose log:  Meter: truemetrix  She did bring in meter or glucose log to review.   She does check her blood sugars regularly.  Breakfast: 75-111  Lunch: 82-128  Dinner: 84-165  Bedtime:     Diet: She is not following a diabetes friendly diet. She has cut down on bread. Does not drink soda, sweet teas.   Hypoglycemia: she reports no hypoglycemic events.   Frequency and timing of hypoglycemia: n/a  Severity of hypoglycemic episode:n/a  Hypoglycemic threshold: <80  Patient is aware of hypoglycemic symptoms including weakness, shakiness, sweating, palpitations.    Education: She has had diabetes  education.  Physical activity: none    Complications: She has known complications  Neuropathy: none  Nephropathy: none  Retinopathy: unknown. Her last dilated eye exam was n/a.   Dental: uptodate  CAD: none  CVA: none  PVD: none  History of recent hospitalization: none   Last episode of DKA or hyperosmolar coma: none    H/o frequent UTIs/yeast infections: used to have yeast infections once a month or every other month. Last episode was 06/2018.   H/o pancreatitis or gallstones: none  FHx of pancreatic cancer or thyroid cancer: thinks her dad had pancreatitis     Family history:  CAD: none  Diabetes: both parents, sister have type 2     Interval history:  Her BG have come down to 70-110s mostly since starting Trulicity, she is eating less and has even lost 5 lbs.   She had constipation from Trulicity initially, but now doing ok.   Continues to be on Lantus 10 units.   Her GAD-65 and insulin Ab were mildly positive.  Vit D  slightly low, lipid panel WNL.      __________________________________________________________________________  Current Outpatient Medications   Medication Sig Dispense Refill   . dulaglutide (TRULICITY) 0.75 mg/0.5 mL Subcutaneous Pen Injector 0.5 mL (0.75 mg total) by Subcutaneous route Every 7 days 2 mL 4   . ergocalciferol, vitamin D2, (DRISDOL) 1,250 mcg (50,000 unit) Oral Capsule TAKE ONE CAPSULE BY MOUTH ONCE A WEEK     . LANTUS SOLOSTAR U-100 INSULIN 100 unit/mL (3 mL) Subcutaneous Insulin Pen Inject 10 units every day under the skin at bedtime.     . TECHLITE PEN NEEDLE 31 gauge x 5/16" Does not apply Needle use to inject insulin daily     . TRUE METRIX GLUCOSE TEST STRIP Does not apply Strip USE TO test blood sugar levels 4 times per day 400 Each 3   . valACYclovir (VALTREX) 500 mg Oral Tablet TAKE ONE TABLET BY MOUTH EVERY DAY       No current facility-administered medications for this visit.       Past Medical History:   Diagnosis Date   . Allergic rhinitis    . Diabetes mellitus, type  2 (CMS HCC)        Social History     Socioeconomic History   . Marital status: Single     Spouse name: Not on file   . Number of children: Not on file   . Years of education: Not on file   . Highest education level: Not on file   Occupational History   . Not on file   Tobacco Use   . Smoking status: Never Smoker   . Smokeless tobacco: Never Used   Vaping Use   . Vaping Use: Never used   Substance and Sexual Activity   . Alcohol use: No   . Drug use: No   . Sexual activity: Not on file   Other Topics Concern   . Not on file   Social History Narrative   . Not on file     Social Determinants of Health     Financial Resource Strain:    . Difficulty of Paying Living Expenses:    Food Insecurity:    . Worried About Programme researcher, broadcasting/film/video in the Last Year:    . Barista in the Last Year:    Transportation Needs:    . Freight forwarder (Medical):    Marland Kitchen Lack of Transportation (Non-Medical):    Physical Activity:    . Days of Exercise per Week:    . Minutes of Exercise per Session:    Stress:    . Feeling of Stress :    Intimate Partner Violence:    . Fear of Current or Ex-Partner:    . Emotionally Abused:    Marland Kitchen Physically Abused:    . Sexually Abused:      Family Medical History:     Problem Relation (Age of Onset)    Cancer Mother    Diabetes Mother    Hypertension (High Blood Pressure) Mother            @ALLERGYP @  _____________________________________________________________________________    Review of Systems -  Positives are marked with bold letters.  Constitutional: fatigue, confusion, changes in mental status    Skin: lesions, rashes, sores, discoloration.  Eyes: double vision, blurring, tearing, loss of vision  Cardiovascular: palpitations, chest pain, claudication.  Respiratory: dyspnea, DOE, Cough.  Gastrointestinal: nausea, vomiting, diarrhea, constipation, abdominal pain.  Genitourinary: bladder incontinence, dysuria, hematuria.  Musculoskeletal: myalgias, joint pain, decreased range of  motion.  Neurological: dizziness, tingling, neuropathic pain, headaches.   Endocrine: heat or cold intolerance, polyphagia, polydipsia, polyuria, nocturia.  Psychiatric: mood changes, depression   All other systems negative.    Physical Exam:  Vitals: There were no vitals taken for this visit.      Wt Readings from Last 3 Encounters:   03/18/19 55.8 kg (123 lb)     Constitutional: She is well-developed, well-nourished, and in no distress.   Head: Normocephalic. EOMI  Neck: Normal range of motion. Neck supple. No thyromegaly present.   Cardiovascular: Normal rate, regular rhythm, normal heart sounds and intact distal pulses. Exam reveals no murmur, gallop, or friction rub.   Pulmonary/Chest: Effort normal and breath sounds normal. No respiratory distress. She has no wheezes. She has no rales.   Abdominal: Soft. Normal appearance No tenderness. No hernia.   Musculoskeletal: Normal range of motion. She exhibits no edema.   Neurological: She is alert. She is not disoriented.   Skin: Skin is warm. No rash noted. No erythema. No pallor.   Feet no edema    Labs           _____________________________________________________________________________    Assessment / Impression & Plan:  Impression: Ann Chandler is a 29 y.o. female with history of T2DM, Vit D deficiency who presents for diabetes management.     Type 2 DM controlled, A1c 7.6%   2 antibodies were positive, so LADA is a possibility. Will check C-peptide to assess insulin reserve and will continue monitoring it periodically.      1. Diabetes medication changes:   -Increase Trulicity to 1.5 mg weekly.   -Stop Lantus   -Continue checking BG 4 times a day, maintain a log and bring it to next visit. She is interested in [facility]  -She wants to have another kid. I advised her that A1C<7% before she can plan pregnancy     2. Education addressed during this appointment today:  blood sugar goals, insulin adjustments, SMBG skills, nutrition and site rotation    3.  Hypertension: Yes. Blood pressure is under good control. I recommend monitoring off meds.    4. Kidney function: Creatinine done within the past year and normal. Labs: No results found for: CREATURINE.    5. Diabetic neuropathy- The patient No evidence of peripheral neuropathy with decreased sensation to monofilament and decreased vibratory sensation in addition to symptoms consist with numbness and tingling in her lower extremity. Recommend to continue with regular podiatry check-up and prevention of diabetes complication.    6. Hyperlipidemia: unknown. Her LDL goal is < 100 mg/dl (CHD or "CHD risk equivalent" is present). LDL is under good control. she is currently not taking a statin.  Age <40, 10 year ASCVD risk <20%  (statin not recommended).     7.Anti-platelet therapy - ADA recommends aspirin therapy (75-162 mg/day) as a primary prevention strategy in those with diabetes who are at increased cardiovascular risk (10-year risk > 10%). This includes most men or women with diabetes aged > 50 years who have at least one additional major risk factor (family history of premature atherosclerotic cardiovascular disease (<3 years of age in a first-degree female relative or <2 years of age in a first-degree female relative), hypertension, smoking, dyslipidemia, or albuminuria) and are not at increased risk of bleeding. Patient currently takes: does not take ASA     8. Eye Examinaton: The patient is overdue for an eye examination and will  make an appointment in the near future.    Orders Placed This Encounter   . C- Peptide   . Glucose, Non Fasting   . dulaglutide (TRULICITY) 1.5 XY/5.8 mL Subcutaneous Pen Injector       RTC in 3 months.      Gwenith Spitz, MD  Endocrinology  [address] [place]  [address]  [phone] (office)  [phone] (fax)  [email or web address]  Pager- [phone]

## 2019-04-18 LAB — PC POCT A1C FINGERSTICK - HIE ONLY: HGB A1C POC: 7.6 % — ABNORMAL HIGH (ref 2.5–5.6)

## 2019-04-19 LAB — C-PEPTIDE: C-PEPTIDE: 4.3 ng/mL (ref 1.1–4.4)

## 2019-04-20 ENCOUNTER — Telehealth (INDEPENDENT_AMBULATORY_CARE_PROVIDER_SITE_OTHER): Payer: Self-pay | Admitting: INTERNAL MEDICINE-ENDOCRINOLOGY-DIABETES AND METABOLISM

## 2019-04-20 NOTE — Telephone Encounter (Signed)
Called patient to inform her her C-peptide was normal, indicating good endogenous insulin production. She understands.       Ann Palmer, MD

## 2019-05-03 ENCOUNTER — Encounter (INDEPENDENT_AMBULATORY_CARE_PROVIDER_SITE_OTHER): Payer: Self-pay | Admitting: INTERNAL MEDICINE-ENDOCRINOLOGY-DIABETES AND METABOLISM

## 2019-05-13 ENCOUNTER — Telehealth (INDEPENDENT_AMBULATORY_CARE_PROVIDER_SITE_OTHER): Payer: Self-pay | Admitting: INTERNAL MEDICINE-ENDOCRINOLOGY-DIABETES AND METABOLISM

## 2019-05-13 NOTE — Telephone Encounter (Signed)
She called and took her trulicity on Monday. On Tuesday she was super sick to her stomach. She wants to switch to taking on Friday that way if she continues to get nauseated, she will be home ---she does not work weekend.  When can she start taking on fridays?

## 2019-05-16 NOTE — Telephone Encounter (Signed)
PATIENT ADVISED

## 2019-06-14 ENCOUNTER — Telehealth (INDEPENDENT_AMBULATORY_CARE_PROVIDER_SITE_OTHER): Payer: Self-pay | Admitting: INTERNAL MEDICINE-ENDOCRINOLOGY-DIABETES AND METABOLISM

## 2019-06-14 NOTE — Telephone Encounter (Signed)
PLEASE GIVE PT A CALL

## 2019-07-22 ENCOUNTER — Ambulatory Visit (INDEPENDENT_AMBULATORY_CARE_PROVIDER_SITE_OTHER): Payer: 59 | Admitting: INTERNAL MEDICINE-ENDOCRINOLOGY-DIABETES AND METABOLISM

## 2019-07-22 ENCOUNTER — Encounter (INDEPENDENT_AMBULATORY_CARE_PROVIDER_SITE_OTHER): Payer: Self-pay | Admitting: INTERNAL MEDICINE-ENDOCRINOLOGY-DIABETES AND METABOLISM

## 2019-07-22 ENCOUNTER — Other Ambulatory Visit: Payer: Self-pay

## 2019-07-22 ENCOUNTER — Other Ambulatory Visit (INDEPENDENT_AMBULATORY_CARE_PROVIDER_SITE_OTHER): Payer: Self-pay | Admitting: INTERNAL MEDICINE-ENDOCRINOLOGY-DIABETES AND METABOLISM

## 2019-07-22 VITALS — BP 112/70 | HR 98 | Ht 61.0 in | Wt 112.2 lb

## 2019-07-22 DIAGNOSIS — E119 Type 2 diabetes mellitus without complications: Secondary | ICD-10-CM

## 2019-07-22 DIAGNOSIS — Z79899 Other long term (current) drug therapy: Secondary | ICD-10-CM

## 2019-07-22 DIAGNOSIS — Z6821 Body mass index (BMI) 21.0-21.9, adult: Secondary | ICD-10-CM

## 2019-07-22 LAB — PC POCT GLUCOSE FINGERSTICK - HIE ONLY: GLUCOSE POC - HIE ONLY: 202

## 2019-07-22 MED ORDER — FREESTYLE LIBRE 2 READER
0 refills | Status: AC
Start: 2019-07-22 — End: ?

## 2019-07-22 MED ORDER — TRULICITY 1.5 MG/0.5 ML SUBCUTANEOUS PEN INJECTOR
1.5000 mg | PEN_INJECTOR | SUBCUTANEOUS | 4 refills | Status: AC
Start: 2019-07-22 — End: ?

## 2019-07-22 MED ORDER — FREESTYLE LIBRE 2 SENSOR KIT
PACK | 3 refills | Status: AC
Start: 2019-07-22 — End: ?

## 2019-07-22 NOTE — Progress Notes (Signed)
[facility]  ENDOCRINOLOGY, MEDICAL STAFF OFFICE BLDG  [address]  [place] [address]      Clinical Care Team:  -Referring Provider for today's visit: Ethelene Hal, [place]  -Primary Care Provider: Jarvis Newcomer, MD    chief complaint: T2DM    Ann Chandler is a 29 y.o. female with PMH of T2DM, Vit D deficiency. The initial diagnosis of diabetes was made in 2016 by her PCP.   She was on metformin briefly. She was having side effects from it and stopped it. She got pregnant in 2016, and was started on metformin again. She took it for 1st and 2nd trimester. Tradjenta was then added. For last 2 months of pregnancy she was on insulin. [place] in [place].     She was again put on metformin from 2018 to 2019, then XR metformin, which she did not tolerate either. She tried Trulicity for 2 weeks in 2019, and it worked to improve her BG. She did tolerate it. She was on tradjenta-glipizide in 2020 but had headaches from it. She was started on Lantus 10 units in 01/2019. This was stopped by me in 03/2019 when she was started on Trulicity.      Her home regimen is:   Trulicity 1.5 mg weekly              Home blood glucose log:  Meter: truemetrix  She did bring in meter or glucose log to review.   She does check her blood sugars regularly.  Breakfast: 85-141  Lunch: 83-110  Dinner: 92-130s  Bedtime: 90-110s    Diet: She is following a diabetes friendly diet. She has cut down on bread. Does not drink soda, sweet teas.   Hypoglycemia: she reports no hypoglycemic events.   Frequency and timing of hypoglycemia: n/a  Severity of hypoglycemic episode:n/a  Hypoglycemic threshold: <80  Patient is aware of hypoglycemic symptoms including weakness, shakiness, sweating, palpitations.    Education: She has had diabetes education.  Physical activity: none    Complications: She has known complications  Neuropathy: none  Nephropathy: none  Retinopathy: unknown. Her last dilated eye exam was n/a.    Dental: uptodate  CAD: none  CVA: none  PVD: none  History of recent hospitalization: none   Last episode of DKA or hyperosmolar coma: none    H/o frequent UTIs/yeast infections: used to have yeast infections once a month or every other month. Last episode was 06/2018.   H/o pancreatitis or gallstones: none  FHx of pancreatic cancer or thyroid cancer: thinks her dad had pancreatitis     Family history:  CAD: none  Diabetes: both parents, sister have type 2     Interval history:  She is tolerating Trulicity 1.5 mg weekly.    Her GAD-65 and insulin Ab were mildly positive.  Vit D slightly low, lipid panel WNL.   C-peptide was normal on last visit. __________________________________________________________________________  Current Outpatient Medications   Medication Sig Dispense Refill   . calcium carbonate/vitamin D3 (VITAMIN D-3 ORAL) Take 1 Tablet by mouth Once a day     . dulaglutide (TRULICITY) 1.5 OF/1.2 mL Subcutaneous Pen Injector 0.5 mL (1.5 mg total) by Subcutaneous route Every 7 days 2 mL 3   . TECHLITE PEN NEEDLE 31 gauge x 5/16" Does not apply Needle use to inject insulin daily     . TRUE METRIX GLUCOSE TEST STRIP Does not apply Strip USE TO test blood sugar  levels 4 times per day 400 Each 3   . valACYclovir (VALTREX) 500 mg Oral Tablet TAKE ONE TABLET BY MOUTH EVERY DAY       No current facility-administered medications for this visit.       Past Medical History:   Diagnosis Date   . Allergic rhinitis    . Diabetes mellitus, type 2 (CMS HCC)        Social History     Socioeconomic History   . Marital status: Single     Spouse name: Not on file   . Number of children: Not on file   . Years of education: Not on file   . Highest education level: Not on file   Occupational History   . Not on file   Tobacco Use   . Smoking status: Never Smoker   . Smokeless tobacco: Never Used   Vaping Use   . Vaping Use: Never used   Substance and Sexual Activity   . Alcohol use: No   . Drug use: No   . Sexual activity: Not  on file   Other Topics Concern   . Not on file   Social History Narrative   . Not on file     Social Determinants of Health     Financial Resource Strain:    . Difficulty of Paying Living Expenses:    Food Insecurity:    . Worried About Charity fundraiser in the Last Year:    . Arboriculturist in the Last Year:    Transportation Needs:    . Film/video editor (Medical):    Marland Kitchen Lack of Transportation (Non-Medical):    Physical Activity:    . Days of Exercise per Week:    . Minutes of Exercise per Session:    Stress:    . Feeling of Stress :    Intimate Partner Violence:    . Fear of Current or Ex-Partner:    . Emotionally Abused:    Marland Kitchen Physically Abused:    . Sexually Abused:      Family Medical History:     Problem Relation (Age of Onset)    Cancer Mother    Diabetes Mother    Hypertension (High Blood Pressure) Mother        _____________________________________________________________________________    Review of Systems -  Positives are marked with bold letters.  Constitutional: fatigue, confusion, changes in mental status    Skin: lesions, rashes, sores, discoloration.  Eyes: double vision, blurring, tearing, loss of vision  Cardiovascular: palpitations, chest pain, claudication.  Respiratory: dyspnea, DOE, Cough.  Gastrointestinal: nausea, vomiting, diarrhea, constipation, abdominal pain.  Genitourinary: bladder incontinence, dysuria, hematuria.   Musculoskeletal: myalgias, joint pain, decreased range of motion.  Neurological: dizziness, tingling, neuropathic pain, headaches.   Endocrine: heat or cold intolerance, polyphagia, polydipsia, polyuria, nocturia.  Psychiatric: mood changes, depression   All other systems negative.    Physical Exam:  Vitals: BP 112/70   Pulse 98   Ht 1.549 m (_0 )   Wt 50.9 kg (112 lb 3.2 oz)   SpO2 99%   Breastfeeding No   BMI 21.20 kg/m       Wt Readings from Last 3 Encounters:   07/22/19 50.9 kg (112 lb 3.2 oz)   04/15/19 53.5 kg (118 lb)   03/18/19 55.8 kg (123 lb)      Constitutional: She is well-developed, well-nourished, and in no distress.   Head: Normocephalic. EOMI  Neck: Normal range  of motion. Neck supple. No thyromegaly present.   Cardiovascular: Normal rate, regular rhythm, normal heart sounds and intact distal pulses. Exam reveals no murmur, gallop, or friction rub.   Pulmonary/Chest: Effort normal and breath sounds normal. No respiratory distress. She has no wheezes. She has no rales.   Abdominal: Soft. Normal appearance No tenderness. No hernia.   Musculoskeletal: Normal range of motion. She exhibits no edema.   Neurological: She is alert. She is not disoriented.   Skin: Skin is warm. No rash noted. No erythema. No pallor.   Feet no edema. Sensation intact to a 10 gram monofilament below the ankles. Vibration sense mildly diminished below the ankles (07/22/19)      Labs           _____________________________________________________________________________    Assessment / Impression & Plan:  Impression: Ann Chandler is a 29 y.o. female with history of T2DM, Vit D deficiency who presents for diabetes management.     Type 2 DM controlled, A1c 6.1%   2 antibodies were positive, so LADA is a possibility. Her C-peptide was normal.      1. Diabetes medication changes:   -Continue Trulicity to 1.5 mg weekly.   -Continue checking BG 4 times a day  -Will order [employer] today  -She wants to have another kid. I advised her that Trulicity will need to be stopped before she starts trying to get pregnant and she will need diet modification/insulin while pregnant.     2. Education addressed during this appointment today:  blood sugar goals, insulin adjustments, SMBG skills, nutrition and site rotation    3. Hypertension: No. Blood pressure is under good control. I recommend monitoring off meds.    4. Kidney function: Creatinine done within the past year and normal. Labs: No results found for: CREATURINE, MICALBCREAT.    5. Diabetic neuropathy- The patient No evidence of peripheral  neuropathy with decreased sensation to monofilament and decreased vibratory sensation in addition to symptoms consist with numbness and tingling in her lower extremity. Recommend to continue with regular podiatry check-up and prevention of diabetes complication.    6. Hyperlipidemia: No. Her LDL goal is < 100 mg/dl (CHD or "CHD risk equivalent" is present). LDL is under good control. she is currently not taking a statin.  Age <40, 10 year ASCVD risk <20%  (statin not recommended).     7.Anti-platelet therapy - ADA recommends aspirin therapy (75-162 mg/day) as a primary prevention strategy in those with diabetes who are at increased cardiovascular risk (10-year risk > 10%). This includes most men or women with diabetes aged > 2 years who have at least one additional major risk factor (family history of premature atherosclerotic cardiovascular disease (<36 years of age in a first-degree female relative or <73 years of age in a first-degree female relative), hypertension, smoking, dyslipidemia, or albuminuria) and are not at increased risk of bleeding. Patient currently takes: does not take ASA     8. Eye Examinaton: The patient is overdue for an eye examination and will make an appointment in the near future.    Orders Placed This Encounter   . flash glucose scanning reader (FREESTYLE LIBRE 2 READER) Does not apply Misc   . flash glucose sensor (FREESTYLE LIBRE 2 SENSOR) Does not apply Kit   . dulaglutide (TRULICITY) 1.5 GN/5.6 mL Subcutaneous Pen Injector     Return in about 6 months (around 01/22/2020).        Gwenith Spitz, MD  Endocrinology  [phone]  [address] [place]  [phone] (office)  [phone] (fax)  [email or web address]  Pager- [phone]

## 2019-07-27 LAB — PC POCT A1C FINGERSTICK - HIE ONLY: HGB A1C POC: 6.1 % — ABNORMAL HIGH (ref 2.5–5.6)

## 2019-10-28 ENCOUNTER — Encounter (INDEPENDENT_AMBULATORY_CARE_PROVIDER_SITE_OTHER): Payer: Self-pay | Admitting: INTERNAL MEDICINE-ENDOCRINOLOGY-DIABETES AND METABOLISM

## 2020-02-08 ENCOUNTER — Other Ambulatory Visit (INDEPENDENT_AMBULATORY_CARE_PROVIDER_SITE_OTHER): Payer: Self-pay | Admitting: INTERNAL MEDICINE-ENDOCRINOLOGY-DIABETES AND METABOLISM
